# Patient Record
Sex: Female | Born: 1955 | Race: White | Hispanic: No | Marital: Married | Smoking: Never smoker
Health system: Southern US, Community
[De-identification: ages and names within clinical notes are randomized; demographics above are authoritative.]

## PROBLEM LIST (undated history)

## (undated) DIAGNOSIS — F419 Anxiety disorder, unspecified: Secondary | ICD-10-CM

## (undated) DIAGNOSIS — E785 Hyperlipidemia, unspecified: Secondary | ICD-10-CM

## (undated) DIAGNOSIS — M810 Age-related osteoporosis without current pathological fracture: Secondary | ICD-10-CM

## (undated) DIAGNOSIS — C541 Malignant neoplasm of endometrium: Secondary | ICD-10-CM

## (undated) HISTORY — DX: Age-related osteoporosis without current pathological fracture: M81.0

## (undated) HISTORY — PX: FOOT SURGERY: SHX648

## (undated) HISTORY — DX: Anxiety disorder, unspecified: F41.9

## (undated) HISTORY — DX: Hyperlipidemia, unspecified: E78.5

## (undated) HISTORY — DX: Malignant neoplasm of endometrium: C54.1

---

## 2001-01-07 ENCOUNTER — Encounter: Admission: RE | Admit: 2001-01-07 | Discharge: 2001-01-07 | Payer: Self-pay | Admitting: Gynecology

## 2001-01-07 ENCOUNTER — Encounter: Payer: Self-pay | Admitting: Gynecology

## 2001-08-12 ENCOUNTER — Other Ambulatory Visit: Admission: RE | Admit: 2001-08-12 | Discharge: 2001-08-12 | Payer: Self-pay | Admitting: Gynecology

## 2001-10-28 ENCOUNTER — Other Ambulatory Visit: Admission: RE | Admit: 2001-10-28 | Discharge: 2001-10-28 | Payer: Self-pay | Admitting: Gynecology

## 2002-05-12 ENCOUNTER — Other Ambulatory Visit: Admission: RE | Admit: 2002-05-12 | Discharge: 2002-05-12 | Payer: Self-pay | Admitting: Gynecology

## 2002-08-11 ENCOUNTER — Encounter: Payer: Self-pay | Admitting: Gynecology

## 2002-08-11 ENCOUNTER — Encounter: Admission: RE | Admit: 2002-08-11 | Discharge: 2002-08-11 | Payer: Self-pay | Admitting: Gynecology

## 2003-02-23 ENCOUNTER — Other Ambulatory Visit: Admission: RE | Admit: 2003-02-23 | Discharge: 2003-02-23 | Payer: Self-pay | Admitting: Gynecology

## 2003-08-17 ENCOUNTER — Encounter: Admission: RE | Admit: 2003-08-17 | Discharge: 2003-08-17 | Payer: Self-pay | Admitting: Gynecology

## 2003-08-17 ENCOUNTER — Encounter: Payer: Self-pay | Admitting: Gynecology

## 2004-08-15 ENCOUNTER — Other Ambulatory Visit: Admission: RE | Admit: 2004-08-15 | Discharge: 2004-08-15 | Payer: Self-pay | Admitting: Gynecology

## 2004-08-22 ENCOUNTER — Encounter: Admission: RE | Admit: 2004-08-22 | Discharge: 2004-08-22 | Payer: Self-pay | Admitting: Gynecology

## 2004-09-06 ENCOUNTER — Encounter: Admission: RE | Admit: 2004-09-06 | Discharge: 2004-09-06 | Payer: Self-pay | Admitting: Gynecology

## 2005-09-25 ENCOUNTER — Other Ambulatory Visit: Admission: RE | Admit: 2005-09-25 | Discharge: 2005-09-25 | Payer: Self-pay | Admitting: Gynecology

## 2006-10-08 ENCOUNTER — Other Ambulatory Visit: Admission: RE | Admit: 2006-10-08 | Discharge: 2006-10-08 | Payer: Self-pay | Admitting: Gynecology

## 2006-10-22 ENCOUNTER — Encounter: Admission: RE | Admit: 2006-10-22 | Discharge: 2006-10-22 | Payer: Self-pay | Admitting: Gynecology

## 2007-11-11 ENCOUNTER — Other Ambulatory Visit: Admission: RE | Admit: 2007-11-11 | Discharge: 2007-11-11 | Payer: Self-pay | Admitting: Gynecology

## 2007-11-14 ENCOUNTER — Encounter: Admission: RE | Admit: 2007-11-14 | Discharge: 2007-11-14 | Payer: Self-pay | Admitting: Gynecology

## 2009-02-01 ENCOUNTER — Encounter: Admission: RE | Admit: 2009-02-01 | Discharge: 2009-02-01 | Payer: Self-pay | Admitting: Gynecology

## 2009-09-21 ENCOUNTER — Encounter (INDEPENDENT_AMBULATORY_CARE_PROVIDER_SITE_OTHER): Payer: Self-pay | Admitting: *Deleted

## 2009-10-11 ENCOUNTER — Ambulatory Visit: Payer: Self-pay | Admitting: Internal Medicine

## 2009-10-26 ENCOUNTER — Ambulatory Visit: Payer: Self-pay | Admitting: Internal Medicine

## 2009-11-02 ENCOUNTER — Encounter: Payer: Self-pay | Admitting: Internal Medicine

## 2010-02-14 ENCOUNTER — Encounter: Admission: RE | Admit: 2010-02-14 | Discharge: 2010-02-14 | Payer: Self-pay | Admitting: Gynecology

## 2010-12-04 DIAGNOSIS — C541 Malignant neoplasm of endometrium: Secondary | ICD-10-CM

## 2010-12-04 HISTORY — DX: Malignant neoplasm of endometrium: C54.1

## 2010-12-04 HISTORY — PX: ABDOMINAL HYSTERECTOMY: SHX81

## 2010-12-24 ENCOUNTER — Encounter: Payer: Self-pay | Admitting: Gynecology

## 2011-05-15 ENCOUNTER — Other Ambulatory Visit: Payer: Self-pay | Admitting: Gynecology

## 2011-05-15 ENCOUNTER — Ambulatory Visit
Admission: RE | Admit: 2011-05-15 | Discharge: 2011-05-15 | Disposition: A | Discharge status: Home / Self Care | Payer: BC Managed Care – PPO | Source: Ambulatory Visit | Attending: Gynecology | Admitting: Gynecology

## 2011-05-15 DIAGNOSIS — Z1231 Encounter for screening mammogram for malignant neoplasm of breast: Secondary | ICD-10-CM

## 2011-08-02 ENCOUNTER — Other Ambulatory Visit: Payer: Self-pay | Admitting: Gynecology

## 2011-09-25 ENCOUNTER — Ambulatory Visit (HOSPITAL_BASED_OUTPATIENT_CLINIC_OR_DEPARTMENT_OTHER)
Admission: RE | Admit: 2011-09-25 | Discharge: 2011-09-25 | Disposition: A | Discharge status: Home / Self Care | Payer: BC Managed Care – PPO | Source: Ambulatory Visit | Attending: Gynecology | Admitting: Gynecology

## 2011-09-25 ENCOUNTER — Other Ambulatory Visit: Payer: Self-pay | Admitting: Gynecology

## 2011-09-25 DIAGNOSIS — Z01812 Encounter for preprocedural laboratory examination: Secondary | ICD-10-CM | POA: Insufficient documentation

## 2011-09-25 DIAGNOSIS — N840 Polyp of corpus uteri: Secondary | ICD-10-CM | POA: Insufficient documentation

## 2011-09-25 DIAGNOSIS — Z01810 Encounter for preprocedural cardiovascular examination: Secondary | ICD-10-CM | POA: Insufficient documentation

## 2011-09-25 NOTE — Op Note (Signed)
  NAME:  Kelli Mclean, Kelli Mclean NO.:  [id number]  MEDICAL RECORD NO.:  [id number]  LOCATION:                                 FACILITY:  PHYSICIAN:  Gretta Cool, M.D. DATE OF BIRTH:  1956-06-11  DATE OF PROCEDURE:  09/25/2011 DATE OF DISCHARGE:                              OPERATIVE REPORT   PREOPERATIVE DIAGNOSIS:  Multiple endometrial polyps with postmenopausal bleeding.  POSTOPERATIVE DIAGNOSIS:  Multiple endometrial polyps with postmenopausal bleeding.  PROCEDURE:  Resection of multiple endometrial polyps.  SURGEON:  Gretta Cool, MD  ANESTHESIA:  IV sedation and paracervical block.  DESCRIPTION OF PROCEDURE:  Under excellent anesthesia as above, with the patient prepped and draped in lithotomy position, the bladder was drained.  The cervix was then grasped with a single-tooth tenaculum and progressively dilated with a series of Pratt dilators to accommodate 7- mm resectoscope.  The resectoscope was then inserted and the cavity photographed.  There were multiple polyps present in the endometrial cavity, particularly in both cornual areas.  The polyps were progressively resected, resecting virtually all of the endometrial cavity to effectively resect the endometrial polyps.  The bleeding points were treated by cautery and VaporTrode near the cornual area for persistent bleeding.  At this point, the procedure was terminated without complication.  Tissue submitted to Path.  FLUID DEFICIT:  135 cc.  COMPLICATIONS:  None.          ______________________________ Gretta Cool, M.D.     CWL/MEDQ  D:  09/25/2011  T:  09/25/2011  Job:  [id number]  cc:   Harrel Lemon. Merla Riches, M.D. Fax: [phone]  Stan Head. Cleta Alberts, M.D. Fax: [phone]  Electronically Signed by Beather Arbour M.D. on 09/25/2011 01:11:15 PM

## 2012-02-15 ENCOUNTER — Ambulatory Visit: Payer: BC Managed Care – PPO

## 2012-02-15 ENCOUNTER — Ambulatory Visit (INDEPENDENT_AMBULATORY_CARE_PROVIDER_SITE_OTHER): Payer: BC Managed Care – PPO | Admitting: Internal Medicine

## 2012-02-15 VITALS — BP 129/81 | HR 56 | Temp 98.4°F | Resp 16 | Ht 62.0 in | Wt 127.8 lb

## 2012-02-15 DIAGNOSIS — M79609 Pain in unspecified limb: Secondary | ICD-10-CM

## 2012-02-15 DIAGNOSIS — R42 Dizziness and giddiness: Secondary | ICD-10-CM

## 2012-02-15 DIAGNOSIS — R079 Chest pain, unspecified: Secondary | ICD-10-CM

## 2012-02-15 DIAGNOSIS — E785 Hyperlipidemia, unspecified: Secondary | ICD-10-CM | POA: Insufficient documentation

## 2012-02-15 DIAGNOSIS — Z8249 Family history of ischemic heart disease and other diseases of the circulatory system: Secondary | ICD-10-CM | POA: Insufficient documentation

## 2012-02-15 DIAGNOSIS — R110 Nausea: Secondary | ICD-10-CM

## 2012-02-15 MED ORDER — ASPIRIN 325 MG PO TABS: 325.0000 mg | ORAL_TABLET | Freq: Every day | ORAL | Status: DC

## 2012-02-15 NOTE — Progress Notes (Signed)
  Subjective:    Patient ID: Kelli Mclean, female    DOB: 1956-03-17, 56 y.o.   MRN: [id number]  HPI Has had L chest and arm pain for 3d, pain woke her 5am today. No hx of heart disease, HBP, DM, and does not smoke. Stat EKG today is normal with the dull, vague pain. Has assoc. Nausea, and dizzy. No syncope, vomiting.  Does take statin and 81mg  asa. No other health issues   Review of Systems     Objective:   Physical Exam  Constitutional: She is oriented to person, place, and time. She appears well-developed and well-nourished.  Eyes: EOM are normal.  Neck: Normal range of motion. Neck supple.  Cardiovascular: Normal rate, regular rhythm and normal heart sounds.   Pulmonary/Chest: Effort normal and breath sounds normal.  Neurological: She is alert and oriented to person, place, and time.  Skin: Skin is warm and dry.  Psychiatric: She has a normal mood and affect.    [facility] reading (PRIMARY) by  Dr.Rena Sweeden CXR. NAD        Assessment & Plan:  Refer to [employer] cardiolog today or tomorrow. Spoke with Juanito Doom MD 325mg  ASA po now

## 2012-02-16 ENCOUNTER — Ambulatory Visit: Payer: Self-pay | Admitting: Physician Assistant

## 2012-02-19 ENCOUNTER — Ambulatory Visit (INDEPENDENT_AMBULATORY_CARE_PROVIDER_SITE_OTHER): Payer: BC Managed Care – PPO | Admitting: Internal Medicine

## 2012-02-19 ENCOUNTER — Encounter: Payer: Self-pay | Admitting: Internal Medicine

## 2012-02-19 ENCOUNTER — Telehealth: Payer: Self-pay | Admitting: Cardiovascular Disease

## 2012-02-19 VITALS — BP 118/78 | HR 78 | Ht 62.0 in | Wt 128.0 lb

## 2012-02-19 DIAGNOSIS — M79602 Pain in left arm: Secondary | ICD-10-CM

## 2012-02-19 DIAGNOSIS — E782 Mixed hyperlipidemia: Secondary | ICD-10-CM

## 2012-02-19 DIAGNOSIS — E785 Hyperlipidemia, unspecified: Secondary | ICD-10-CM

## 2012-02-19 DIAGNOSIS — R079 Chest pain, unspecified: Secondary | ICD-10-CM

## 2012-02-19 DIAGNOSIS — M79609 Pain in unspecified limb: Secondary | ICD-10-CM

## 2012-02-19 LAB — TROPONIN I: Troponin I: <0.3 ng/mL (ref <0.30)

## 2012-02-19 MED ORDER — NITROGLYCERIN 0.4 MG SL SUBL: 0.4000 mg | SUBLINGUAL_TABLET | SUBLINGUAL | Status: AC | PRN

## 2012-02-19 MED ORDER — SIMVASTATIN 20 MG PO TABS: 20.0000 mg | ORAL_TABLET | Freq: Every evening | ORAL | Status: DC

## 2012-02-19 NOTE — Telephone Encounter (Signed)
Call from [facility]. D-dimer is 0.52 which is just out of the normal range with upper limit of 0.48. Troponin negative. Spoke to Dr. Tenny Craw. No action necessary tonight. cdm

## 2012-02-19 NOTE — Progress Notes (Signed)
HPI  Patient is a 56 year old who was referred for evaluation of arm pain/chest pain.   A week ago today woke up wit pain shooting up L arm.  Stop.  Nausea, foggy.  Got up, got dressed  Went to work.  Came on intermittently at work.  BP at [employer]  OK  Was SOB. NO CP Tuesday chest heaviness  L arm ached.  No SOB  Heaviness in chest just stayed. Wednesday  Same. Shooting pain came/went.  Heaviness came and went  Over the weekend has had any arm pain, no chest heaviness.  No SOB  Just tired.  No dizziness.  Today still tired.  NO SOB or CP Has worked out this morning.  Did fine.  No CP  No SOB.  Aerobic activity for 25 minutes.  NO change in endurance. No Known Allergies  Current Outpatient Prescriptions  Medication Sig Dispense Refill  . aspirin 81 MG tablet Take 81 mg by mouth daily.      . cholecalciferol (VITAMIN D) 1000 UNITS tablet Take 1,000 Units by mouth daily.      . citalopram (CELEXA) 20 MG tablet Take 20 mg by mouth daily.      . fish oil-omega-3 fatty acids 1000 MG capsule Take 1 g by mouth daily.      Marland Kitchen GLUCOSAMINE CHONDROITIN COMPLX PO Take by mouth. Taking 2 daily      . Multiple Vitamins-Minerals (MULTIVITAMIN WITH MINERALS) tablet Take 1 tablet by mouth daily.      . simvastatin (ZOCOR) 20 MG tablet Take 20 mg by mouth every evening.      Marland Kitchen ibuprofen (ADVIL,MOTRIN) 600 MG tablet as needed.      Marland Kitchen VIVELLE-DOT 0.025 MG/24HR as directed.       Current Facility-Administered Medications  Medication Dose Route Frequency Provider Last Rate Last Dose  . aspirin tablet 325 mg  325 mg Oral Daily Jonita Albee, MD        Past Medical History  Diagnosis Date  . Hyperlipidemia     Past Surgical History  Procedure Date  . Abdominal hysterectomy     Family History  Problem Relation Age of Onset  . Heart disease Mother     History   Social History  . Marital Status: Married    Spouse Name: N/A    Number of Children: N/A  . Years of Education: N/A   Occupational  History  . Not on file.   Social History Main Topics  . Smoking status: Never Smoker   . Smokeless tobacco: Not on file  . Alcohol Use: Not on file  . Drug Use: Not on file  . Sexually Active: Not on file   Other Topics Concern  . Not on file   Social History Narrative  . No narrative on file    Review of Systems:  All systems reviewed.  They are negative to the above problem except as previously stated.  Vital Signs: BP 118/78  Pulse 78  Ht 5\' 2"  (1.575 m)  Wt 128 lb (58.06 kg)  BMI 23.41 kg/m2  Physical Exam Patient is in NAD HEENT:  Normocephalic, atraumatic. EOMI, PERRLA.  Neck: JVP is normal. No thyromegaly. No bruits.  Lungs: clear to auscultation. No rales no wheezes.  Heart: Regular rate and rhythm. Normal S1, S2. No S3.   No significant murmurs. PMI not displaced.  Abdomen:  Supple, nontender. Normal bowel sounds. No masses. No hepatomegaly.  Extremities:   Good distal pulses throughout.  No lower extremity edema. NO reproduction in symptoms with movement of arms. Musculoskeletal :moving all extremities.  Neuro:   alert and oriented x3.  CN II-XII grossly intact.  EKG:   SR>  Assessment and Plan:

## 2012-02-19 NOTE — Patient Instructions (Signed)
Lab work today We will call you with results. 

## 2012-02-21 DIAGNOSIS — R079 Chest pain, unspecified: Secondary | ICD-10-CM | POA: Insufficient documentation

## 2012-02-21 NOTE — Assessment & Plan Note (Signed)
Keep on statin. 

## 2012-02-21 NOTE — Assessment & Plan Note (Signed)
Patient;s symptoms are concerning  BUt, she worked out 25 minutes this morning without a problem. This is like a negative stress test. Exam is unremarkable.   I would keep on ASA.  NTG prn Will check Trop and d dimer. If negative, follow symptoms Take activities as tolerated.

## 2012-03-22 ENCOUNTER — Encounter: Payer: Self-pay | Admitting: Internal Medicine

## 2012-03-27 ENCOUNTER — Other Ambulatory Visit: Payer: Self-pay | Admitting: Gynecology

## 2012-05-30 ENCOUNTER — Other Ambulatory Visit: Payer: Self-pay | Admitting: Gynecology

## 2012-05-30 DIAGNOSIS — Z1231 Encounter for screening mammogram for malignant neoplasm of breast: Secondary | ICD-10-CM

## 2012-05-31 ENCOUNTER — Ambulatory Visit
Admission: RE | Admit: 2012-05-31 | Discharge: 2012-05-31 | Disposition: A | Discharge status: Home / Self Care | Payer: BC Managed Care – PPO | Source: Ambulatory Visit | Attending: Gynecology | Admitting: Gynecology

## 2012-05-31 DIAGNOSIS — Z1231 Encounter for screening mammogram for malignant neoplasm of breast: Secondary | ICD-10-CM

## 2012-08-29 ENCOUNTER — Other Ambulatory Visit: Payer: Self-pay | Admitting: Gynecology

## 2013-07-30 ENCOUNTER — Other Ambulatory Visit: Payer: Self-pay

## 2013-07-30 DIAGNOSIS — Z1231 Encounter for screening mammogram for malignant neoplasm of breast: Secondary | ICD-10-CM

## 2013-08-26 ENCOUNTER — Ambulatory Visit
Admission: RE | Admit: 2013-08-26 | Discharge: 2013-08-26 | Disposition: A | Discharge status: Home / Self Care | Payer: BC Managed Care – PPO | Source: Ambulatory Visit

## 2013-08-26 DIAGNOSIS — Z1231 Encounter for screening mammogram for malignant neoplasm of breast: Secondary | ICD-10-CM

## 2014-02-19 IMAGING — CR DG CHEST 2V
2 series · 2 of 2 positions shown · non-contrast
Comparison: Preliminary reading of Dr. Locklear

CLINICAL DATA: Left-sided chest pain

CHEST - 2 VIEW

[PA]
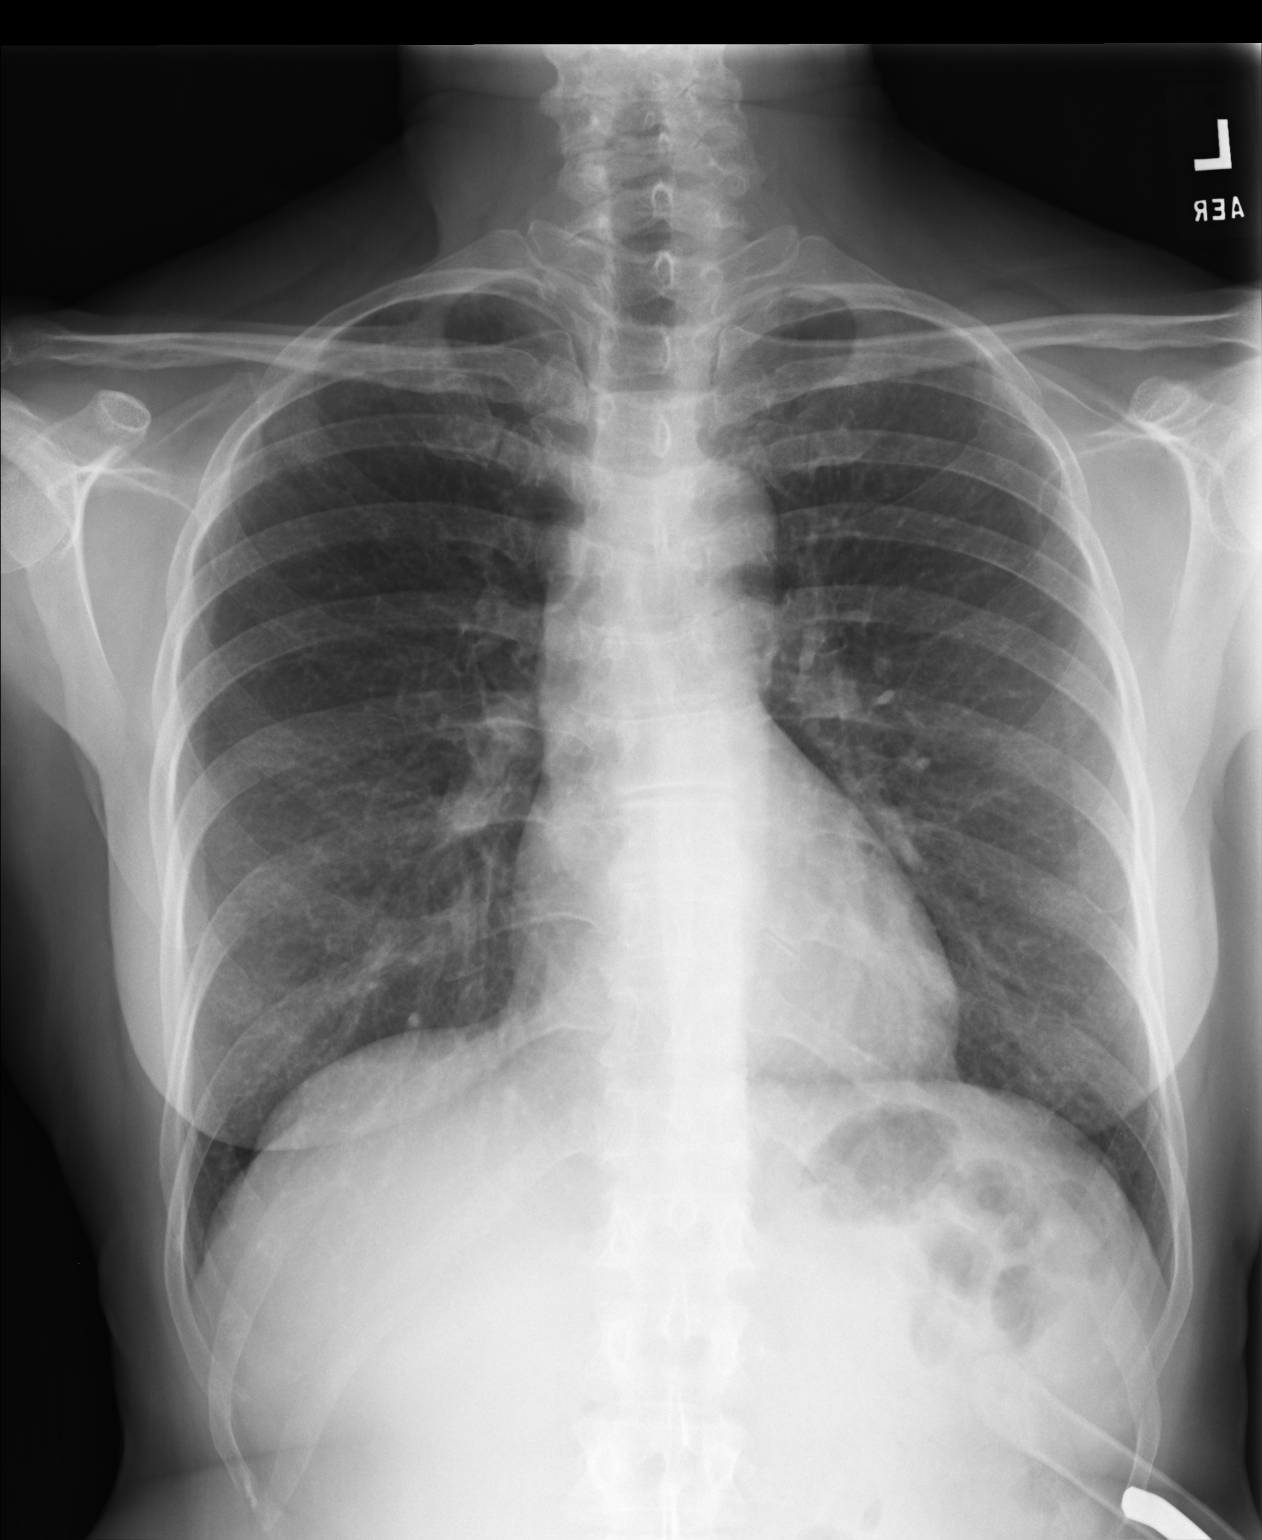

[lateral]
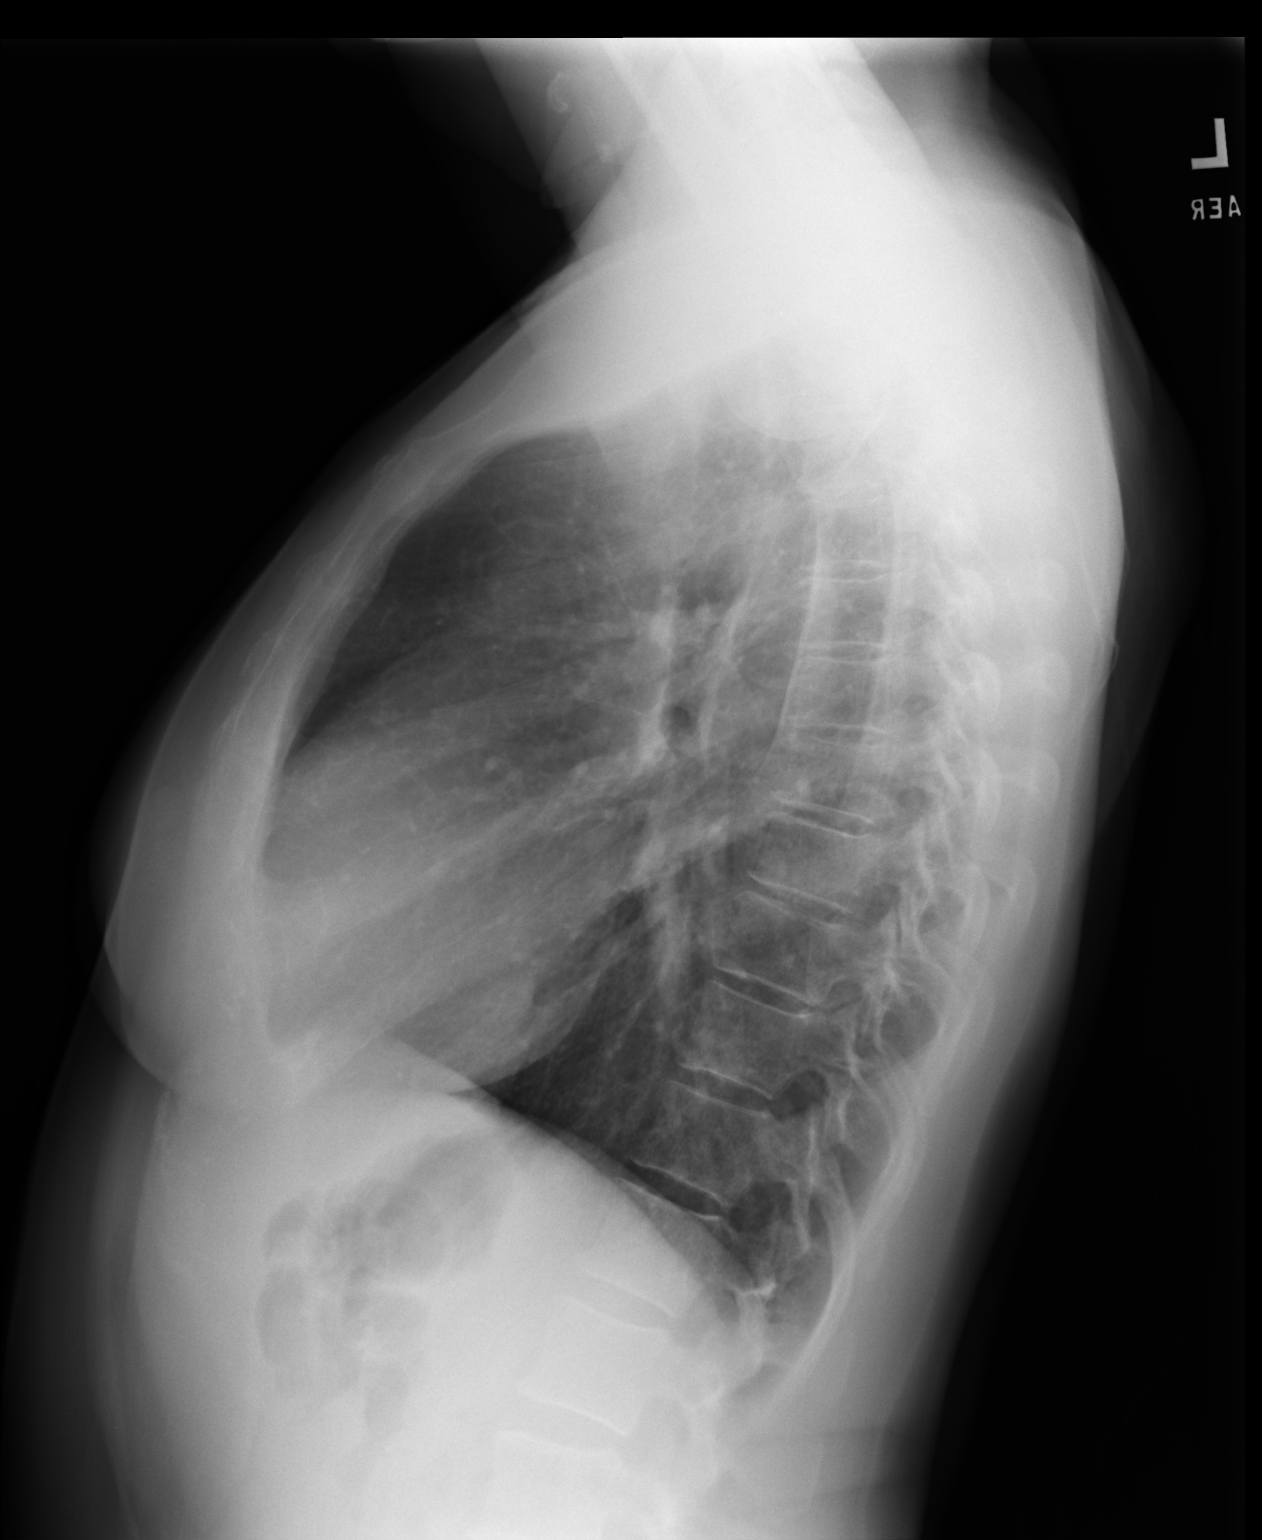

[2 of 2 positions shown; findings below may reference images not displayed]

FINDINGS: Cardiomediastinal silhouette is unremarkable.  No acute
infiltrate or pleural effusion.  No pulmonary edema. Bony thorax is
unremarkable.
IMPRESSION: No active disease.

## 2014-06-30 ENCOUNTER — Ambulatory Visit (INDEPENDENT_AMBULATORY_CARE_PROVIDER_SITE_OTHER): Payer: BC Managed Care – PPO

## 2014-06-30 ENCOUNTER — Ambulatory Visit: Payer: BC Managed Care – PPO

## 2014-06-30 ENCOUNTER — Ambulatory Visit (INDEPENDENT_AMBULATORY_CARE_PROVIDER_SITE_OTHER): Payer: BC Managed Care – PPO | Admitting: Family Medicine

## 2014-06-30 VITALS — BP 128/70 | HR 64 | Temp 98.7°F | Resp 16 | Ht 62.5 in | Wt 128.6 lb

## 2014-06-30 DIAGNOSIS — M25474 Effusion, right foot: Secondary | ICD-10-CM

## 2014-06-30 DIAGNOSIS — M79609 Pain in unspecified limb: Secondary | ICD-10-CM

## 2014-06-30 DIAGNOSIS — M25579 Pain in unspecified ankle and joints of unspecified foot: Secondary | ICD-10-CM

## 2014-06-30 DIAGNOSIS — M79671 Pain in right foot: Secondary | ICD-10-CM

## 2014-06-30 DIAGNOSIS — M25476 Effusion, unspecified foot: Secondary | ICD-10-CM

## 2014-06-30 DIAGNOSIS — M25571 Pain in right ankle and joints of right foot: Secondary | ICD-10-CM

## 2014-06-30 DIAGNOSIS — M25473 Effusion, unspecified ankle: Secondary | ICD-10-CM

## 2014-06-30 NOTE — Progress Notes (Addendum)
Subjective:    Patient ID: Kelli Mclean, female    DOB: 03/19/1956, 58 y.o.   MRN: [id number]  This chart was scribed for Merri Ray, MD by Erling Conte, Medical Scribe. This patient was sehen in Room 2 and the patient's care was started at 8:14 PM.  Chief Complaint  Patient presents with  . Foot Injury    rt foot slipped and fell July 5 th and still swollen and sore   HPI HPI Comments: Kelli Mclean is a 58 y.o. female who presents to the [facility] complaining of an injury to her right foot that occurred about 3 weeks ago. [place] She states she was able to walk and bear weight after the injury without much difficulty. She reports that she has had associated swelling and bruising to the injured area. She states that day after the injury she applied ice with some relief. Patient reports that since the injury she has had a "knot" on the top of her foot that is painful to the touch. She denies any broken bones in her foot previously. She states she previously had surgery just below her big toe for bone spurs.      Patient Active Problem List   Diagnosis Date Noted  . Chest pain 02/21/2012  . Hyperlipidemia 02/15/2012  . Family history of heart attack 02/15/2012   Past Medical History  Diagnosis Date  . Hyperlipidemia    Past Surgical History  Procedure Laterality Date  . Abdominal hysterectomy     No Known Allergies Prior to Admission medications   Medication Sig Start Date End Date Taking? Authorizing Provider  aspirin 81 MG tablet Take 81 mg by mouth daily.   Yes Historical Provider, MD  cholecalciferol (VITAMIN D) 1000 UNITS tablet Take 1,000 Units by mouth daily.   Yes Historical Provider, MD  fish oil-omega-3 fatty acids 1000 MG capsule Take 1 g by mouth daily.   Yes Historical Provider, MD  GLUCOSAMINE CHONDROITIN COMPLX PO Take by mouth. Taking 2  daily   Yes Historical Provider, MD  ibuprofen (ADVIL,MOTRIN) 600 MG tablet as needed. 11/21/11  Yes Historical Provider, MD  Multiple Vitamins-Minerals (MULTIVITAMIN WITH MINERALS) tablet Take 1 tablet by mouth daily.   Yes Historical Provider, MD  simvastatin (ZOCOR) 20 MG tablet Take 1 tablet (20 mg total) by mouth every evening. 02/19/12  Yes Fay Records, MD  citalopram (CELEXA) 20 MG tablet Take 20 mg by mouth daily.    Historical Provider, MD  VIVELLE-DOT 0.025 MG/24HR as directed. 01/01/12   Historical Provider, MD   History   Social History  . Marital Status: Married    Spouse Name: N/A    Number of Children: N/A  . Years of Education: N/A   Occupational History  . Not on file.   Social History Main Topics  . Smoking status: Never Smoker   . Smokeless tobacco: Not on file  . Alcohol Use: Not on file  . Drug Use: Not on file  . Sexual Activity: Not on file   Other Topics Concern  . Not on file   Social History Narrative  . No narrative on file     Review of Systems  Musculoskeletal: Positive for arthralgias (left foot) and joint swelling (right foot, mostly resolved).  Skin: Positive for color change (bruising to the top and side of  foot).  All other systems reviewed and are negative.      Objective:   Physical Exam  Nursing note and vitals reviewed. Constitutional: She is oriented to person, place, and time. She appears well-developed and well-nourished. No distress.  HENT:  Head: Normocephalic and atraumatic.  Eyes: Conjunctivae and EOM are normal.  Neck: Neck supple. No tracheal deviation present.  Cardiovascular: Normal rate.   Pulmonary/Chest: Effort normal. No respiratory distress.  Musculoskeletal: Normal range of motion.       Right ankle: She exhibits swelling (soft tissue with localized swelling just anterior to lateral malleolus). She exhibits normal range of motion. Achilles tendon normal. Achilles tendon exhibits no pain, no defect and normal  Thompson's test results.  slight tenderness along distal fibula just proximal to the malleolus Right Ankle: achilles tendon is non tender Full strength with plantar flexion Well healed scar over dorsum of right great toe, non tender Navicula non tender Some tenderness along base of 5th MT to mid 5th MT Tenderness over faint at base of second 3/5th toes on dorsum of foot only Diffused tenderness on dorsum of foot NVI distally  Neurological: She is alert and oriented to person, place, and time.  Skin: Skin is warm and dry.  Psychiatric: She has a normal mood and affect. Her behavior is normal.    Filed Vitals:   06/30/14 2006  BP: 128/70  Pulse: 64  Temp: 98.7 F (37.1 C)  TempSrc: Oral  Resp: 16  Height: 5' 2.5" (1.588 m)  Weight: 128 lb 9.6 oz (58.333 kg)  SpO2: 100%    [facility] reading (PRIMARY) by  Dr. Carlota Raspberry:  R foot: negative, R ankle: negative. .     Assessment & Plan:  TANZA PELLOT is a 57 y.o. female Pain, ankle, right - Plan: DG Ankle Complete Right, DG Foot Complete Right  Foot pain, right - Plan: DG Ankle Complete Right, DG Foot Complete Right  Swelling of foot joint, right - Plan: DG Ankle Complete Right, DG Foot Complete Right Suspected initial ankle sprain with some residual swelling. Has good function and rom, and has been able to exercise on affected ankle. Discussed unlikely phlebitis in this area and no signs or sx's of calf swelling.  Sx care and HEP discussed, rtc precautions if not improving in next few weeks.   No orders of the defined types were placed in this encounter.   Patient Instructions  No broken bones seen on xray. Relative rest as able, and elevate foot when possible.  If swelling not improving in next few weeks, recheck.  Return to the clinic or go to the nearest emergency room if any of your symptoms worsen or new symptoms occur.

## 2014-06-30 NOTE — Patient Instructions (Signed)
No broken bones seen on xray. Relative rest as able, and elevate foot when possible.  If swelling not improving in next few weeks, recheck.  Return to the clinic or go to the nearest emergency room if any of your symptoms worsen or new symptoms occur.

## 2014-07-23 ENCOUNTER — Ambulatory Visit (INDEPENDENT_AMBULATORY_CARE_PROVIDER_SITE_OTHER): Payer: BC Managed Care – PPO | Admitting: Family Medicine

## 2014-07-23 VITALS — BP 136/80 | HR 70 | Temp 98.9°F | Resp 16 | Ht 62.0 in | Wt 125.6 lb

## 2014-07-23 DIAGNOSIS — R197 Diarrhea, unspecified: Secondary | ICD-10-CM

## 2014-07-23 DIAGNOSIS — R110 Nausea: Secondary | ICD-10-CM

## 2014-07-23 DIAGNOSIS — A09 Infectious gastroenteritis and colitis, unspecified: Secondary | ICD-10-CM

## 2014-07-23 DIAGNOSIS — K5289 Other specified noninfective gastroenteritis and colitis: Secondary | ICD-10-CM

## 2014-07-23 MED ORDER — CIPROFLOXACIN HCL 500 MG PO TABS: 500.0000 mg | ORAL_TABLET | Freq: Two times a day (BID) | ORAL | Status: DC

## 2014-07-23 MED ORDER — ONDANSETRON 4 MG PO TBDP: 4.0000 mg | ORAL_TABLET | Freq: Three times a day (TID) | ORAL | Status: DC | PRN

## 2014-07-23 NOTE — Patient Instructions (Signed)
Food Choices to Help Relieve Diarrhea °When you have diarrhea, the foods you eat and your eating habits are very important. Choosing the right foods and drinks can help relieve diarrhea. Also, because diarrhea can last up to 7 days, you need to replace lost fluids and electrolytes (such as sodium, potassium, and chloride) in order to help prevent dehydration.  °WHAT GENERAL GUIDELINES DO I NEED TO FOLLOW? °· Slowly drink 1 cup (8 oz) of fluid for each episode of diarrhea. If you are getting enough fluid, your urine will be clear or pale yellow. °· Eat starchy foods. Some good choices include white rice, white toast, pasta, low-fiber cereal, baked potatoes (without the skin), saltine crackers, and bagels. °· Avoid large servings of any cooked vegetables. °· Limit fruit to two servings per day. A serving is ½ cup or 1 small piece. °· Choose foods with less than 2 g of fiber per serving. °· Limit fats to less than 8 tsp (38 g) per day. °· Avoid fried foods. °· Eat foods that have probiotics in them. Probiotics can be found in certain dairy products. °· Avoid foods and beverages that may increase the speed at which food moves through the stomach and intestines (gastrointestinal tract). Things to avoid include: °¨ High-fiber foods, such as dried fruit, raw fruits and vegetables, nuts, seeds, and whole grain foods. °¨ Spicy foods and high-fat foods. °¨ Foods and beverages sweetened with high-fructose corn syrup, honey, or sugar alcohols such as xylitol, sorbitol, and mannitol. °WHAT FOODS ARE RECOMMENDED? °Grains °White rice. White, French, or pita breads (fresh or toasted), including plain rolls, buns, or bagels. White pasta. Saltine, soda, or graham crackers. Pretzels. Low-fiber cereal. Cooked cereals made with water (such as cornmeal, farina, or cream cereals). Plain muffins. Matzo. Melba toast. Zwieback.  °Vegetables °Potatoes (without the skin). Strained tomato and vegetable juices. Most well-cooked and canned  vegetables without seeds. Tender lettuce. °Fruits °Cooked or canned applesauce, apricots, cherries, fruit cocktail, grapefruit, peaches, pears, or plums. Fresh bananas, apples without skin, cherries, grapes, cantaloupe, grapefruit, peaches, oranges, or plums.  °Meat and Other Protein Products °Baked or boiled chicken. Eggs. Tofu. Fish. Seafood. Smooth peanut butter. Ground or well-cooked tender beef, ham, veal, lamb, pork, or poultry.  °Dairy °Plain yogurt, kefir, and unsweetened liquid yogurt. Lactose-free milk, buttermilk, or soy milk. Plain hard cheese. °Beverages °Sport drinks. Clear broths. Diluted fruit juices (except prune). Regular, caffeine-free sodas such as ginger ale. Water. Decaffeinated teas. Oral rehydration solutions. Sugar-free beverages not sweetened with sugar alcohols. °Other °Bouillon, broth, or soups made from recommended foods.  °The items listed above may not be a complete list of recommended foods or beverages. Contact your dietitian for more options. °WHAT FOODS ARE NOT RECOMMENDED? °Grains °Whole grain, whole wheat, bran, or rye breads, rolls, pastas, crackers, and cereals. Wild or brown rice. Cereals that contain more than 2 g of fiber per serving. Corn tortillas or taco shells. Cooked or dry oatmeal. Granola. Popcorn. °Vegetables °Raw vegetables. Cabbage, broccoli, Brussels sprouts, artichokes, baked beans, beet greens, corn, kale, legumes, peas, sweet potatoes, and yams. Potato skins. Cooked spinach and cabbage. °Fruits °Dried fruit, including raisins and dates. Raw fruits. Stewed or dried prunes. Fresh apples with skin, apricots, mangoes, pears, raspberries, and strawberries.  °Meat and Other Protein Products °Chunky peanut butter. Nuts and seeds. Beans and lentils. Bacon.  °Dairy °High-fat cheeses. Milk, chocolate milk, and beverages made with milk, such as milk shakes. Cream. Ice cream. °Sweets and Desserts °Sweet rolls, doughnuts, and sweet breads. Pancakes   and waffles. °Fats and  Oils °Butter. Cream sauces. Margarine. Salad oils. Plain salad dressings. Olives. Avocados.  °Beverages °Caffeinated beverages (such as coffee, tea, soda, or energy drinks). Alcoholic beverages. Fruit juices with pulp. Prune juice. Soft drinks sweetened with high-fructose corn syrup or sugar alcohols. °Other °Coconut. Hot sauce. Chili powder. Mayonnaise. Gravy. Cream-based or milk-based soups.  °The items listed above may not be a complete list of foods and beverages to avoid. Contact your dietitian for more information. °WHAT SHOULD I DO IF I BECOME DEHYDRATED? °Diarrhea can sometimes lead to dehydration. Signs of dehydration include dark urine and dry mouth and skin. If you think you are dehydrated, you should rehydrate with an oral rehydration solution. These solutions can be purchased at pharmacies, retail stores, or online.  °Drink ½-1 cup (120-240 mL) of oral rehydration solution each time you have an episode of diarrhea. If drinking this amount makes your diarrhea worse, try drinking smaller amounts more often. For example, drink 1-3 tsp (5-15 mL) every 5-10 minutes.  °A general rule for staying hydrated is to drink 1½-2 L of fluid per day. Talk to your health care provider about the specific amount you should be drinking each day. Drink enough fluids to keep your urine clear or pale yellow. °Document Released: 02/10/2004 Document Revised: 11/25/2013 Document Reviewed: 10/13/2013 °ExitCare® Patient Information ©2015 [employer]. This information is not intended to replace advice given to you by your health care provider. Make sure you discuss any questions you have with your health care provider. °Diarrhea °Diarrhea is frequent loose and watery bowel movements. It can cause you to feel weak and dehydrated. Dehydration can cause you to become tired and thirsty, have a dry mouth, and have decreased urination that often is dark yellow. Diarrhea is a sign of another problem, most often an infection that will  not last long. In most cases, diarrhea typically lasts 2-3 days. However, it can last longer if it is a sign of something more serious. It is important to treat your diarrhea as directed by your caregiver to lessen or prevent future episodes of diarrhea. °CAUSES  °Some common causes include: °· Gastrointestinal infections caused by viruses, bacteria, or parasites. °· Food poisoning or food allergies. °· Certain medicines, such as antibiotics, chemotherapy, and laxatives. °· Artificial sweeteners and fructose. °· Digestive disorders. °HOME CARE INSTRUCTIONS °· Ensure adequate fluid intake (hydration): Have 1 cup (8 oz) of fluid for each diarrhea episode. Avoid fluids that contain simple sugars or sports drinks, fruit juices, whole milk products, and sodas. Your urine should be clear or pale yellow if you are drinking enough fluids. Hydrate with an oral rehydration solution that you can purchase at pharmacies, retail stores, and online. You can prepare an oral rehydration solution at home by mixing the following ingredients together: °¨  - tsp table salt. °¨ ¾ tsp baking soda. °¨  tsp salt substitute containing potassium chloride. °¨ 1  tablespoons sugar. °¨ 1 L (34 oz) of water. °· Certain foods and beverages may increase the speed at which food moves through the gastrointestinal (GI) tract. These foods and beverages should be avoided and include: °¨ Caffeinated and alcoholic beverages. °¨ High-fiber foods, such as raw fruits and vegetables, nuts, seeds, and whole grain breads and cereals. °¨ Foods and beverages sweetened with sugar alcohols, such as xylitol, sorbitol, and mannitol. °· Some foods may be well tolerated and may help thicken stool including: °¨ Starchy foods, such as rice, toast, pasta, low-sugar cereal, oatmeal, grits, baked potatoes,   crackers, and bagels. °¨ Bananas. °¨ Applesauce. °· Add probiotic-rich foods to help increase healthy bacteria in the GI tract, such as yogurt and fermented milk  products. °· Wash your hands well after each diarrhea episode. °· Only take over-the-counter or prescription medicines as directed by your caregiver. °· Take a warm bath to relieve any burning or pain from frequent diarrhea episodes. °SEEK IMMEDIATE MEDICAL CARE IF:  °· You are unable to keep fluids down. °· You have persistent vomiting. °· You have blood in your stool, or your stools are black and tarry. °· You do not urinate in 6-8 hours, or there is only a small amount of very dark urine. °· You have abdominal pain that increases or localizes. °· You have weakness, dizziness, confusion, or light-headedness. °· You have a severe headache. °· Your diarrhea gets worse or does not get better. °· You have a fever or persistent symptoms for more than 2-3 days. °· You have a fever and your symptoms suddenly get worse. °MAKE SURE YOU:  °· Understand these instructions. °· Will watch your condition. °· Will get help right away if you are not doing well or get worse. °Document Released: 11/10/2002 Document Revised: 04/06/2014 Document Reviewed: 07/28/2012 °ExitCare® Patient Information ©2015 [employer]. This information is not intended to replace advice given to you by your health care provider. Make sure you discuss any questions you have with your health care provider. ° °

## 2014-07-23 NOTE — Progress Notes (Signed)
 Chief Complaint:  Chief Complaint  Patient presents with  . Abdominal Pain    got sick while in [place] last thursday. she has been vomiting and diarrhea.      HPI: Kelli Mclean is a 58 y.o. female who is here for  Worsening nonbloody diarrhea since Thursday, she started having stomach problems while on vacation in [place], she ate something wrong on one of the excursion , she was sick and she took immodium which helped some but then the diarrhea returne,  today she was on "comode for 7 times" . She did not have HA, no neck rigidity, feels tired but is trying to make herself push fluids. She has not had an appetite . She has had poor appetitie, any solid food intake  goes through her. Currently no fevers or chills. No rashes or joint pain or confusion.   She is wondering if she should go back on her antianxiety meds.. She  had a panic attack but has not had it a long time. She was on an antidperessant. She was on Celexa 20 mg daily and she did well, last time she used it  9 months to 1 year, she was on for a long time 2 years. She is thinking about retaking it. She is under a lot of stress and not sure if the anxiety attack is due to all of this new onset illness or if she should really be on it   Past Medical History  Diagnosis Date  . Hyperlipidemia    Past Surgical History  Procedure Laterality Date  . Abdominal hysterectomy      uterine cancer   History   Social History  . Marital Status: Married    Spouse Name: N/A    Number of Children: N/A  . Years of Education: N/A   Social History Main Topics  . Smoking status: Never Smoker   . Smokeless tobacco: None  . Alcohol Use: No  . Drug Use: No  . Sexual Activity: None   Other Topics Concern  . None   Social History Narrative  . None   Family History  Problem Relation Age of Onset  . Heart disease Mother   . Heart failure Father   . Heart disease Paternal Grandfather    No Known Allergies Prior to  Admission medications   Medication Sig Start Date End Date Taking? Authorizing Provider  aspirin 81 MG tablet Take 81 mg by mouth daily.   Yes Historical Provider, MD  cholecalciferol (VITAMIN D) 1000 UNITS tablet Take 1,000 Units by mouth daily.   Yes Historical Provider, MD  fish oil-omega-3 fatty acids 1000 MG capsule Take 1 g by mouth daily.   Yes Historical Provider, MD  GLUCOSAMINE CHONDROITIN COMPLX PO Take by mouth. Taking 2 daily   Yes Historical Provider, MD  ibuprofen (ADVIL,MOTRIN) 600 MG tablet as needed. 11/21/11  Yes Historical Provider, MD  Multiple Vitamins-Minerals (MULTIVITAMIN WITH MINERALS) tablet Take 1 tablet by mouth daily.   Yes Historical Provider, MD  simvastatin (ZOCOR) 20 MG tablet Take 1 tablet (20 mg total) by mouth every evening. 02/19/12  Yes Fay Records, MD  citalopram (CELEXA) 20 MG tablet Take 20 mg by mouth daily.    Historical Provider, MD     ROS: The patient denies fevers, chills, night sweats, unintentional weight loss, chest pain, palpitations, wheezing, dyspnea on exertion, nausea, vomiting, abdominal pain, dysuria, hematuria, melena, numbness, weakness, or tingling. + Nausea, + nonbloody diarrhea.  All other systems have been reviewed and were otherwise negative with the exception of those mentioned in the HPI and as above.    PHYSICAL EXAM: Filed Vitals:   07/23/14 1954  BP: 136/80  Pulse: 70  Temp: 98.9 F (37.2 C)  Resp: 16   Filed Vitals:   07/23/14 1954  Height: 5\' 2"  (1.575 m)  Weight: 125 lb 9.6 oz (56.972 kg)   Body mass index is 22.97 kg/(m^2).  General: Alert, no acute distress HEENT:  Normocephalic, atraumatic, oropharynx patent. EOMI, PERRLA, mucosa moist, no exudates. Tm normal Cardiovascular:  Regular rate and rhythm, no rubs murmurs or gallops.  No Carotid bruits, radial pulse intact. No pedal edema.  Respiratory: Clear to auscultation bilaterally.  No wheezes, rales, or rhonchi.  No cyanosis, no use of accessory  musculature GI: No organomegaly, abdomen is soft and non-tender, positive bowel sounds.  No masses. Skin: No rashes. Neurologic: Facial musculature symmetric. Psychiatric: Patient is appropriate throughout our interaction. Lymphatic: No cervical lymphadenopathy Musculoskeletal: Gait intact.   LABS: Results for orders placed in visit on 02/19/12  D-DIMER, QUANTITATIVE      Result Value Ref Range   D-Dimer, Quant 0.52 (*) 0.00 - 0.48 ug/mL-FEU  TROPONIN I      Result Value Ref Range   Troponin I <0.30  <0.30 ng/mL     EKG/XRAY:   Primary read interpreted by Dr. Marin Comment at [facility].   ASSESSMENT/PLAN: Encounter Diagnoses  Name Primary?  . Nausea alone Yes  . Diarrhea   . Traveler's diarrhea   . Other noninfectious gastroenteritis    BRAT diet, push fluids Cipro 500 mg BID x 5 days Zofran ODT She will let me know once this is over if she needs to go back onto her celexa for anxiety. IF she does we can restart her on it for 6 months. Then recheck in 6 months.  F/u prn  Gross sideeffects, risk and benefits, and alternatives of medications d/w patient. Patient is aware that all medications have potential sideeffects and we are unable to predict every sideeffect or drug-drug interaction that may occur.  , Miner, DO 07/23/2014 8:29 PM

## 2014-08-12 ENCOUNTER — Encounter: Payer: Self-pay | Admitting: Internal Medicine

## 2014-10-02 ENCOUNTER — Other Ambulatory Visit: Payer: Self-pay

## 2014-10-02 DIAGNOSIS — Z1231 Encounter for screening mammogram for malignant neoplasm of breast: Secondary | ICD-10-CM

## 2014-10-05 ENCOUNTER — Ambulatory Visit
Admission: RE | Admit: 2014-10-05 | Discharge: 2014-10-05 | Disposition: A | Discharge status: Home / Self Care | Payer: BC Managed Care – PPO | Source: Ambulatory Visit

## 2014-10-05 ENCOUNTER — Encounter (INDEPENDENT_AMBULATORY_CARE_PROVIDER_SITE_OTHER): Payer: Self-pay

## 2014-10-05 DIAGNOSIS — Z1231 Encounter for screening mammogram for malignant neoplasm of breast: Secondary | ICD-10-CM

## 2014-11-18 ENCOUNTER — Ambulatory Visit (INDEPENDENT_AMBULATORY_CARE_PROVIDER_SITE_OTHER): Payer: BC Managed Care – PPO | Admitting: Family Medicine

## 2014-11-18 ENCOUNTER — Encounter: Payer: Self-pay | Admitting: Family Medicine

## 2014-11-18 VITALS — BP 110/80 | HR 77 | Temp 99.0°F | Resp 16 | Ht 62.0 in | Wt 119.6 lb

## 2014-11-18 DIAGNOSIS — F411 Generalized anxiety disorder: Secondary | ICD-10-CM

## 2014-11-18 DIAGNOSIS — Z0000 Encounter for general adult medical examination without abnormal findings: Secondary | ICD-10-CM

## 2014-11-18 DIAGNOSIS — N6452 Nipple discharge: Secondary | ICD-10-CM

## 2014-11-18 DIAGNOSIS — K644 Residual hemorrhoidal skin tags: Secondary | ICD-10-CM

## 2014-11-18 DIAGNOSIS — Z86010 Personal history of colonic polyps: Secondary | ICD-10-CM

## 2014-11-18 DIAGNOSIS — E782 Mixed hyperlipidemia: Secondary | ICD-10-CM

## 2014-11-18 DIAGNOSIS — Z23 Encounter for immunization: Secondary | ICD-10-CM

## 2014-11-18 DIAGNOSIS — Z1329 Encounter for screening for other suspected endocrine disorder: Secondary | ICD-10-CM

## 2014-11-18 DIAGNOSIS — E785 Hyperlipidemia, unspecified: Secondary | ICD-10-CM

## 2014-11-18 DIAGNOSIS — Z131 Encounter for screening for diabetes mellitus: Secondary | ICD-10-CM

## 2014-11-18 DIAGNOSIS — C55 Malignant neoplasm of uterus, part unspecified: Secondary | ICD-10-CM

## 2014-11-18 DIAGNOSIS — K648 Other hemorrhoids: Secondary | ICD-10-CM

## 2014-11-18 LAB — CBC WITH DIFFERENTIAL/PLATELET
BASOS PCT: 1 % (ref 0–1)
Basophils Absolute: 0 10*3/uL (ref 0.0–0.1)
EOS ABS: 0.1 10*3/uL (ref 0.0–0.7)
EOS PCT: 2 % (ref 0–5)
HEMATOCRIT: 43.9 % (ref 36.0–46.0)
Hemoglobin: 15.1 g/dL — ABNORMAL HIGH (ref 12.0–15.0)
Lymphocytes Relative: 31 % (ref 12–46)
Lymphs Abs: 1.4 10*3/uL (ref 0.7–4.0)
MCH: 31.6 pg (ref 26.0–34.0)
MCHC: 34.4 g/dL (ref 30.0–36.0)
MCV: 91.8 fL (ref 78.0–100.0)
MONOS PCT: 7 % (ref 3–12)
MPV: 11.4 fL (ref 9.4–12.4)
Monocytes Absolute: 0.3 10*3/uL (ref 0.1–1.0)
NEUTROS PCT: 59 % (ref 43–77)
Neutro Abs: 2.7 10*3/uL (ref 1.7–7.7)
Platelets: 200 10*3/uL (ref 150–400)
RBC: 4.78 MIL/uL (ref 3.87–5.11)
RDW: 13.8 % (ref 11.5–15.5)
WBC: 4.5 10*3/uL (ref 4.0–10.5)

## 2014-11-18 MED ORDER — HYDROCORTISONE ACETATE 25 MG RE SUPP: 25.0000 mg | Freq: Two times a day (BID) | RECTAL | Status: DC

## 2014-11-18 MED ORDER — SIMVASTATIN 20 MG PO TABS: 20.0000 mg | ORAL_TABLET | Freq: Every evening | ORAL | Status: DC

## 2014-11-18 MED ORDER — FLUOXETINE HCL 20 MG PO TABS: 20.0000 mg | ORAL_TABLET | Freq: Every day | ORAL | Status: DC

## 2014-11-18 MED ORDER — ALPRAZOLAM 0.25 MG PO TABS: 0.2500 mg | ORAL_TABLET | Freq: Every day | ORAL | Status: DC | PRN

## 2014-11-18 NOTE — Progress Notes (Addendum)
Subjective:    Patient ID: Kelli Mclean, female    DOB: 31-May-1956, 58 y.o.   MRN: [id number]  11/18/2014  Annual Exam   HPI This 58 y.o. female presents for Complete Physical Examination.  Last physical:  2013 Pap smear:  08/02/2011 by Ubaldo Glassing; but Lomax retired.   Mammogram:  10/05/2014 Colonoscopy: 10/26/2009; pedunculated polyp rectum; diverticulosis; repeat 5 years.  [place]. Bone density:  2013 mild osteopenia. TDAP:  Unsure.  Zostavax: never Influenza: 11/18/2014 Eye exam:  Never; +readers Dental exam:  Every six months.  Hemorrhoid issues: onset for the past several weeks.  Requesting medications.  R nipple leakage: s/p mammogram; now noticed that gown is blood stained on a regular basis.  Mammogram WNL.    Anxiety:  Previous Citalopram use without improvement; requesting Xanax; unable to sleep; excessive worry.  Excessive stress with work.    PSH:  Hysterectomy uterine cancer 2012; ovaries removed.  Followed every six months and then follow up with Lomax and then retired.  [facility] at [facility]. Family hx: Mother -- 41s living; CABG age 109; hyperlipidemia. 108 brother -- AMI with 3 stents   Review of Systems  Constitutional: Negative for fever, chills, diaphoresis, activity change, appetite change, fatigue and unexpected weight change.  HENT: Negative for congestion, dental problem, drooling, ear discharge, ear pain, facial swelling, hearing loss, mouth sores, nosebleeds, postnasal drip, rhinorrhea, sinus pressure, sneezing, sore throat, tinnitus, trouble swallowing and voice change.   Eyes: Negative for photophobia, pain, discharge, redness, itching and visual disturbance.  Respiratory: Negative for apnea, cough, choking, chest tightness, shortness of breath, wheezing and stridor.   Cardiovascular: Negative for chest pain, palpitations and leg swelling.  Gastrointestinal: Negative for nausea, vomiting, abdominal pain, diarrhea, constipation, blood in stool, abdominal  distention, anal bleeding and rectal pain.  Endocrine: Negative for cold intolerance, heat intolerance, polydipsia, polyphagia and polyuria.  Genitourinary: Negative for dysuria, urgency, frequency, hematuria, flank pain, decreased urine volume, vaginal bleeding, vaginal discharge, enuresis, difficulty urinating, genital sores, vaginal pain, menstrual problem, pelvic pain and dyspareunia.  Musculoskeletal: Negative for myalgias, back pain, joint swelling, arthralgias, gait problem, neck pain and neck stiffness.  Skin: Negative for color change, pallor, rash and wound.  Allergic/Immunologic: Negative for environmental allergies, food allergies and immunocompromised state.  Neurological: Negative for dizziness, tremors, seizures, syncope, facial asymmetry, speech difficulty, weakness, light-headedness, numbness and headaches.  Hematological: Negative for adenopathy. Does not bruise/bleed easily.  Psychiatric/Behavioral: Negative for suicidal ideas, hallucinations, behavioral problems, confusion, sleep disturbance, self-injury, dysphoric mood, decreased concentration and agitation. The patient is nervous/anxious. The patient is not hyperactive.     Past Medical History  Diagnosis Date  . Hyperlipidemia   . Anxiety   . Cancer    Past Surgical History  Procedure Laterality Date  . Abdominal hysterectomy      uterine cancer   No Known Allergies Current Outpatient Prescriptions  Medication Sig Dispense Refill  . aspirin 81 MG tablet Take 81 mg by mouth daily.    . cholecalciferol (VITAMIN D) 1000 UNITS tablet Take 1,000 Units by mouth daily.    . fish oil-omega-3 fatty acids 1000 MG capsule Take 1 g by mouth daily.    Marland Kitchen GLUCOSAMINE CHONDROITIN COMPLX PO Take by mouth. Taking 2 daily    . ibuprofen (ADVIL,MOTRIN) 600 MG tablet as needed.    . Multiple Vitamins-Minerals (MULTIVITAMIN WITH MINERALS) tablet Take 1 tablet by mouth daily.    . simvastatin (ZOCOR) 40 MG tablet Take 1 tablet (40 mg  total) by  mouth every evening. 90 tablet 3  . ALPRAZolam (XANAX) 0.25 MG tablet Take 1 tablet (0.25 mg total) by mouth daily as needed for anxiety. 20 tablet 1  . CINNAMON PO Take by mouth.    Marland Kitchen FLUoxetine (PROZAC) 20 MG tablet Take 1-2 tablets (20-40 mg total) by mouth daily. 180 tablet 3  . hydrocortisone (ANUSOL-HC) 25 MG suppository Place 1 suppository (25 mg total) rectally 2 (two) times daily. 12 suppository 2  . pravastatin (PRAVACHOL) 40 MG tablet Take 1 tablet (40 mg total) by mouth daily. 90 tablet 3   Current Facility-Administered Medications  Medication Dose Route Frequency Provider Last Rate Last Dose  . aspirin tablet 325 mg  325 mg Oral Daily Orma Flaming, MD           Objective:    BP 110/80 mmHg  Pulse 77  Temp(Src) 99 F (37.2 C) (Oral)  Resp 16  Ht 5\' 2"  (1.575 m)  Wt 119 lb 9.6 oz (54.25 kg)  BMI 21.87 kg/m2  SpO2 99% Physical Exam  Constitutional: She is oriented to person, place, and time. She appears well-developed and well-nourished. No distress.  HENT:  Head: Normocephalic and atraumatic.  Right Ear: External ear normal.  Left Ear: External ear normal.  Nose: Nose normal.  Mouth/Throat: Oropharynx is clear and moist.  Eyes: Conjunctivae and EOM are normal. Pupils are equal, round, and reactive to light.  Neck: Normal range of motion and full passive range of motion without pain. Neck supple. No JVD present. Carotid bruit is not present. No thyromegaly present.  Cardiovascular: Normal rate, regular rhythm and normal heart sounds.  Exam reveals no gallop and no friction rub.   No murmur heard. Pulmonary/Chest: Effort normal and breath sounds normal. She has no wheezes. She has no rales. Right breast exhibits no inverted nipple, no mass, no nipple discharge, no skin change and no tenderness. Left breast exhibits no inverted nipple, no mass, no nipple discharge, no skin change and no tenderness. Breasts are symmetrical.  Abdominal: Soft. Bowel sounds are  normal. She exhibits no distension and no mass. There is no tenderness. There is no rebound and no guarding.  Genitourinary: Vagina normal. Rectal exam shows external hemorrhoid. No breast swelling, tenderness, discharge or bleeding. There is no rash, tenderness or lesion on the right labia. There is no rash, tenderness or lesion on the left labia.  Musculoskeletal:       Right shoulder: Normal.       Left shoulder: Normal.       Cervical back: Normal.  Lymphadenopathy:    She has no cervical adenopathy.  Neurological: She is alert and oriented to person, place, and time. She has normal reflexes. No cranial nerve deficit. She exhibits normal muscle tone. Coordination normal.  Skin: Skin is warm and dry. No rash noted. She is not diaphoretic. No erythema. No pallor.  Psychiatric: She has a normal mood and affect. Her behavior is normal. Judgment and thought content normal.  Nursing note and vitals reviewed.   TDAP AND INFLUENZA VACCINES ADMINISTERED.    Assessment & Plan:   1. Annual physical exam   2. Hyperlipidemia   3. Screening for diabetes mellitus   4. Screening for thyroid disorder   5. Need for prophylactic vaccination and inoculation against influenza   6. Uterine cancer   7. Mixed hyperlipidemia   8. External hemorrhoid   9. Discharge from right nipple   10. History of colonic polyps   11. Need for Tdap  vaccination   12. Generalized anxiety disorder     1. Complete Physical examination: anticipatory guidance --- weight maintenance, exercise.  Pap smear obtained; mammogram UTD. Colonoscopy UTD.  Immunizations reviewed --- s/p TDAP and influenza vaccines. 2.  Hyperlipidemia: controlled; obtain labs; refill provided. 3.  Screening DMII: obtain glucose, HgbA1c. 4.  Screening thyroid: obtain TSH. 5. S/p influenza and TDAP vaccines. 6.  Uterine cancer: s/p hysterectomy; Dr. Ubaldo Glassing of gynecology retired; s/p hysterectomy at [facility].  Pap smear obtained.   7.  External hemorrhoid:  rx for Anusol HC suppositories provided. 8.  R nipple discharge: New. Refer for ductogram. 9.  Generalized anxiety disorder: New. Rx for Prozac and Xanax provided.   Meds ordered this encounter  Medications  . DISCONTD: simvastatin (ZOCOR) 20 MG tablet    Sig: Take 1 tablet (20 mg total) by mouth every evening.    Dispense:  30 tablet    Refill:  11  . DISCONTD: hydrocortisone (ANUSOL-HC) 25 MG suppository    Sig: Place 1 suppository (25 mg total) rectally 2 (two) times daily.    Dispense:  12 suppository    Refill:  2  . DISCONTD: simvastatin (ZOCOR) 20 MG tablet    Sig: Take 1 tablet (20 mg total) by mouth every evening.    Dispense:  90 tablet    Refill:  3  . hydrocortisone (ANUSOL-HC) 25 MG suppository    Sig: Place 1 suppository (25 mg total) rectally 2 (two) times daily.    Dispense:  12 suppository    Refill:  2  . DISCONTD: FLUoxetine (PROZAC) 20 MG tablet    Sig: Take 1 tablet (20 mg total) by mouth daily.    Dispense:  30 tablet    Refill:  3  . ALPRAZolam (XANAX) 0.25 MG tablet    Sig: Take 1 tablet (0.25 mg total) by mouth daily as needed for anxiety.    Dispense:  20 tablet    Refill:  1  . simvastatin (ZOCOR) 40 MG tablet    Sig: Take 1 tablet (40 mg total) by mouth every evening.    Dispense:  90 tablet    Refill:  3    Return in about 3 months (around 02/17/2015) for recheck.    Reginia Forts, M.D.  [facility] [address] [place], [place]  [address] [phone] phone [phone] fax

## 2014-11-18 NOTE — Patient Instructions (Signed)

## 2014-11-19 ENCOUNTER — Other Ambulatory Visit: Payer: Self-pay | Admitting: Family Medicine

## 2014-11-19 ENCOUNTER — Encounter: Payer: Self-pay | Admitting: Internal Medicine

## 2014-11-19 DIAGNOSIS — N6452 Nipple discharge: Secondary | ICD-10-CM

## 2014-11-19 LAB — COMPREHENSIVE METABOLIC PANEL
ALK PHOS: 93 U/L (ref 39–117)
ALT: 17 U/L (ref 0–35)
AST: 19 U/L (ref 0–37)
Albumin: 4.5 g/dL (ref 3.5–5.2)
BILIRUBIN TOTAL: 0.8 mg/dL (ref 0.2–1.2)
BUN: 19 mg/dL (ref 6–23)
CO2: 28 mEq/L (ref 19–32)
CREATININE: 0.75 mg/dL (ref 0.50–1.10)
Calcium: 9.6 mg/dL (ref 8.4–10.5)
Chloride: 105 mEq/L (ref 96–112)
GLUCOSE: 90 mg/dL (ref 70–99)
Potassium: 4.3 mEq/L (ref 3.5–5.3)
SODIUM: 141 meq/L (ref 135–145)
Total Protein: 6.8 g/dL (ref 6.0–8.3)

## 2014-11-19 LAB — LIPID PANEL
CHOL/HDL RATIO: 3 ratio
Cholesterol: 212 mg/dL — ABNORMAL HIGH (ref 0–200)
HDL: 70 mg/dL (ref >39)
LDL Cholesterol: 126 mg/dL — ABNORMAL HIGH (ref 0–99)
TRIGLYCERIDES: 80 mg/dL (ref <150)
VLDL: 16 mg/dL (ref 0–40)

## 2014-11-19 LAB — PAP IG (IMAGE GUIDED)

## 2014-11-19 LAB — HEMOGLOBIN A1C
HEMOGLOBIN A1C: 5.5 % (ref <5.7)
Mean Plasma Glucose: 111 mg/dL (ref <117)

## 2014-11-19 LAB — TSH: TSH: 1.225 u[IU]/mL (ref 0.350–4.500)

## 2014-11-30 MED ORDER — SIMVASTATIN 40 MG PO TABS: 40.0000 mg | ORAL_TABLET | Freq: Every evening | ORAL | Status: DC

## 2014-12-01 ENCOUNTER — Other Ambulatory Visit: Payer: Self-pay

## 2014-12-01 ENCOUNTER — Other Ambulatory Visit: Payer: Self-pay | Admitting: Family Medicine

## 2014-12-01 DIAGNOSIS — N6452 Nipple discharge: Secondary | ICD-10-CM

## 2014-12-08 ENCOUNTER — Other Ambulatory Visit: Payer: Self-pay | Admitting: Family Medicine

## 2014-12-08 ENCOUNTER — Ambulatory Visit
Admission: RE | Admit: 2014-12-08 | Discharge: 2014-12-08 | Disposition: A | Discharge status: Home / Self Care | Payer: BC Managed Care – PPO | Source: Ambulatory Visit | Attending: Family Medicine | Admitting: Family Medicine

## 2014-12-08 DIAGNOSIS — N6452 Nipple discharge: Secondary | ICD-10-CM

## 2014-12-31 ENCOUNTER — Inpatient Hospital Stay: Admission: RE | Admit: 2014-12-31 | Payer: BLUE CROSS/BLUE SHIELD | Source: Ambulatory Visit

## 2014-12-31 ENCOUNTER — Ambulatory Visit
Admission: RE | Admit: 2014-12-31 | Discharge: 2014-12-31 | Disposition: A | Discharge status: Home / Self Care | Payer: BLUE CROSS/BLUE SHIELD | Source: Ambulatory Visit

## 2014-12-31 ENCOUNTER — Other Ambulatory Visit: Payer: Self-pay | Admitting: Family Medicine

## 2014-12-31 DIAGNOSIS — N6452 Nipple discharge: Secondary | ICD-10-CM

## 2015-01-01 ENCOUNTER — Telehealth: Payer: Self-pay | Admitting: Internal Medicine

## 2015-01-04 NOTE — Telephone Encounter (Signed)
Left a message for patient to call back. 

## 2015-01-05 NOTE — Telephone Encounter (Signed)
Left a message for patient to call back. 

## 2015-01-06 NOTE — Telephone Encounter (Signed)
Spoke with patient and told her she will need an OV with MD or extender to be examined to determine the best way to treat her hemorrhoids. She is going to call back to schedule an OV.

## 2015-01-11 ENCOUNTER — Ambulatory Visit (INDEPENDENT_AMBULATORY_CARE_PROVIDER_SITE_OTHER): Payer: Self-pay | Admitting: Surgery

## 2015-01-11 NOTE — H&P (Signed)
History of Present Illness Kelli Mclean. Kelli Balch MD; 01/11/2015 2:36 PM) Patient words: eval right breast.  The patient is a 59 year old female who presents with a complaint of nipple discharge. Referred by Dr. Reginia Mclean for evaluation of persistent right nipple discharge.  This is a 59 yo female who presents with several months of persistent clear right nipple discharge. A few months ago, she began noticing some bloody, darker drainage. She underwent a diagnostic mammogram and ultrasound 12/08/14 after a routine screening mammogram in 11/15. These were all negative. She underwent two attempts at a ductogram on 12/08/14 and 12/31/14. 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 She is now referred for surgical evaluation.  The patient has a history of uterine cancer s/p TAH/BSO. No adjuvant therapy needed.   10/05/14 CLINICAL DATA: Screening.  EXAM: DIGITAL SCREENING BILATERAL MAMMOGRAM WITH 3D TOMO WITH CAD  COMPARISON: Previous exam(s).  ACR Breast Density Category c: The breast tissue is heterogeneously dense, which may obscure small masses.  FINDINGS: There are no findings suspicious for malignancy. Images were processed with CAD.  IMPRESSION: No mammographic evidence of malignancy. A result letter of this screening mammogram will be mailed directly to the patient.  RECOMMENDATION: Screening mammogram in one year. (Code:SM-B-01Y)  BI-RADS CATEGORY 1: Negative.   Electronically Signed By: Kelli Mclean M.D. On: 10/05/2014 19:31   12/08/14 CLINICAL DATA: 59 year old female who reports a several year history of spontaneous clear nipple discharge which has recently become rusty brown in the past several months.  EXAM: DIGITAL DIAGNOSTIC RIGHT MAMMOGRAM WITH CAD  ULTRASOUND RIGHT BREAST  COMPARISON: 10/05/2014, additional prior imaging dating back to 02/01/2009  ACR Breast Density  Category c: The breast tissue is heterogeneously dense, which may obscure small masses.  FINDINGS: No suspicious mass, calcifications, or other abnormality is identified. The breast parenchymal pattern appears stable dating back to at least 2012.  Mammographic images were processed with CAD.  On physical exam, a small amount of clear nipple discharge is expressed from a single duct in the central aspect of the nipple.  Targeted ultrasound of the right retroareolar region demonstrates mild duct ectasia without definite intraductal mass.  IMPRESSION: No mammographic or sonographic explanation is identified for the patient's nipple discharge.  RECOMMENDATION: Further evaluation with right ductogram is recommended.  I have discussed the findings and recommendations with the patient. Results were also provided in writing at the conclusion of the visit. If applicable, a reminder letter will be sent to the patient regarding the next appointment.  BI-RADS CATEGORY 0: Incomplete. Need additional imaging evaluation and/or prior mammograms for comparison.   Electronically Signed By: Kelli Mclean M.D. On: 12/08/2014 15:42   ADDENDUM REPORT: 12/31/2014 12:38 ADDENDUM: The patient returned on 12/31/2014 for a repeat attempt at a right ductogram. A small amount of clear, tan-colored fluid was elicited from a central orifice in the right nipple. The discharging orifice was cannulated with a ductogram needle. With injection of contrast, there was immediate reflux of contrast out of the nipple orifice. Subsequent spot magnification mammogram images demonstrated a small amount of contrast in a central retronipple duct, identical to that seen on 12/08/2014. No contrast extended more posteriorly. IMPRESSION: Obstruction of a central right nipple duct just behind the nipple. 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 An obstructing malignancy is significantly less likely based  on the history of spontaneous discharge for several years, but not excluded. RECOMMENDATION: Surgical consultation  for right duct excision. This has been discussed with the patient. She was given an appointment with Dr. Georgette Dover on 01/11/2015 at 9:30 a.m. BI-RADS CATEGORY: 4: Suspicious. Electronically Signed By: Kelli Mclean M.D. On: 12/31/2014 12:38  Other Problems (Kelli Mclean, [place]; 01/11/2015 9:58 AM) Anxiety Disorder Cancer Hemorrhoids Hypercholesterolemia Lump In Breast  Past Surgical History Kelli Mclean, CMA; 01/11/2015 9:58 AM) Colon Polyp Removal - Colonoscopy Hysterectomy (due to cancer) - Complete  Diagnostic Studies History Kelli Mclean, CMA; 01/11/2015 9:58 AM) Colonoscopy 5-10 years ago Mammogram within last year  Allergies Kelli Mclean, CMA; 01/11/2015 9:59 AM) No Known Drug Allergies 01/11/2015  Medication History (Kelli Mclean, CMA; 01/11/2015 10:01 AM) ALPRAZolam (0.25MG  Tablet, Oral) Active. Simvastatin (40MG  Tablet, Oral) Active. FLUoxetine HCl (20MG  Tablet, Oral) Active. Aspirin EC (81MG  Tablet DR, Oral) Active. FLUoxetine HCl (20MG  Capsule, Oral) Active. Fish Oil (1000MG  Capsule DR, Oral) Active. Vitamin D (1000UNIT Capsule, Oral) Active.  Social History Kelli Mclean, [place]; 01/11/2015 9:58 AM) Alcohol use Occasional alcohol use. Caffeine use Tea. No drug use Tobacco use Never smoker.  Family History Kelli Mclean, [place]; 01/11/2015 9:58 AM) Arthritis Father. Heart Disease Brother, Mother. Heart disease in female family member before age 70 Hypertension Father.  Pregnancy / Birth History Kelli Mclean, [place]; 01/11/2015 9:58 AM) Age at menarche 57 years. Age of menopause 35-60 Gravida 3 Maternal age 56-35 Para 3     Review of Systems ([place]; 01/11/2015 9:58 AM) General Not Present- Appetite Loss, Chills, Fatigue, Fever, Night Sweats, Weight Gain and Weight Loss. Skin Not Present- Change in Wart/Mole, Dryness, Hives, Jaundice,  New Lesions, Non-Healing Wounds, Rash and Ulcer. HEENT Not Present- Earache, Hearing Loss, Hoarseness, Nose Bleed, Oral Ulcers, Ringing in the Ears, Seasonal Allergies, Sinus Pain, Sore Throat, Visual Disturbances, Wears glasses/contact lenses and Yellow Eyes. Respiratory Not Present- Bloody sputum, Chronic Cough, Difficulty Breathing, Snoring and Wheezing. Breast Present- Breast Mass and Nipple Discharge. Not Present- Breast Pain and Skin Changes. Cardiovascular Not Present- Chest Pain, Difficulty Breathing Lying Down, Leg Cramps, Palpitations, Rapid Heart Rate, Shortness of Breath and Swelling of Extremities. Gastrointestinal Present- Hemorrhoids. Not Present- Abdominal Pain, Bloating, Bloody Stool, Change in Bowel Habits, Chronic diarrhea, Constipation, Difficulty Swallowing, Excessive gas, Gets full quickly at meals, Indigestion, Nausea, Rectal Pain and Vomiting. Female Genitourinary Not Present- Frequency, Nocturia, Painful Urination, Pelvic Pain and Urgency. Musculoskeletal Not Present- Back Pain, Joint Pain, Joint Stiffness, Muscle Pain, Muscle Weakness and Swelling of Extremities. Neurological Not Present- Decreased Memory, Fainting, Headaches, Numbness, Seizures, Tingling, Tremor, Trouble walking and Weakness. Psychiatric Present- Anxiety. Not Present- Bipolar, Change in Sleep Pattern, Depression, Fearful and Frequent crying. Endocrine Not Present- Cold Intolerance, Excessive Hunger, Hair Changes, Heat Intolerance, Hot flashes and New Diabetes. Hematology Not Present- Easy Bruising, Excessive bleeding, Gland problems, HIV and Persistent Infections.  Vitals (Kelli Mclean CMA; 01/11/2015 9:59 AM) 01/11/2015 9:59 AM Weight: 125 lb Height: 62in Body Surface Area: 1.57 m Body Mass Index: 22.86 kg/m Temp.: 97.1F(Temporal)  Pulse: 77 (Regular)  BP: 130/70 (Sitting, Left Arm, Standard)     Physical Exam Rodman Key K. Janene Yousuf MD; 01/11/2015 2:36 PM)  The physical exam findings are as  follows: Note:WDWN in NAD HEENT: EOMI, sclera anicteric Neck: No masses, no thyromegaly Lungs: CTA bilaterally; normal respiratory effort Breasts: bilateral fibrocystic changes; no dominant masses; no axillary lymphadenopathy No left nipple discharge Central duct of right nipple - clear drainage with mild palpation CV: Regular rate and rhythm; no murmurs Abd: +bowel sounds, soft, non-tender, no masses Ext: Well-perfused; no edema Skin: Warm,  dry; no sign of jaundice    Assessment & Plan Kelli Mclean. Manon Banbury MD; 01/11/2015 10:40 AM)  DISCHARGE FROM RIGHT NIPPLE (611.79  N64.52)  Current Plans Schedule for Surgery - right nipple duct excision. The surgical procedure has been discussed with the patient. Potential risks, benefits, alternative treatments, and expected outcomes have been explained. All of the patient's questions at this time have been answered. The likelihood of reaching the patient's treatment goal is good. The patient understand the proposed surgical procedure and wishes to proceed.   Kelli Mclean. Georgette Dover, MD, [facility] Surgery  General/ Trauma Surgery  01/11/2015 2:37 PM

## 2015-01-12 ENCOUNTER — Inpatient Hospital Stay: Admission: RE | Admit: 2015-01-12 | Payer: BLUE CROSS/BLUE SHIELD | Source: Ambulatory Visit

## 2015-01-19 ENCOUNTER — Ambulatory Visit (AMBULATORY_SURGERY_CENTER): Payer: Self-pay

## 2015-01-19 VITALS — Ht 62.0 in | Wt 123.8 lb

## 2015-01-19 DIAGNOSIS — Z860100 Personal history of colon polyps, unspecified: Secondary | ICD-10-CM

## 2015-01-19 MED ORDER — MOVIPREP 100 G PO SOLR: 1.0000 | Freq: Once | ORAL | Status: DC

## 2015-01-19 NOTE — Progress Notes (Signed)
No allergies to eggs or soy No home oxygen No diet/weight loss meds No past problems with anesthesia  Has email  Emmi instructions given for colonoscopy 

## 2015-01-26 ENCOUNTER — Encounter: Payer: Self-pay | Admitting: Internal Medicine

## 2015-01-26 ENCOUNTER — Ambulatory Visit (AMBULATORY_SURGERY_CENTER): Payer: BLUE CROSS/BLUE SHIELD | Admitting: Internal Medicine

## 2015-01-26 VITALS — BP 134/81 | HR 65 | Temp 98.0°F | Resp 17 | Ht 62.0 in | Wt 123.0 lb

## 2015-01-26 DIAGNOSIS — D122 Benign neoplasm of ascending colon: Secondary | ICD-10-CM

## 2015-01-26 DIAGNOSIS — D120 Benign neoplasm of cecum: Secondary | ICD-10-CM

## 2015-01-26 DIAGNOSIS — Z86010 Personal history of colonic polyps: Secondary | ICD-10-CM

## 2015-01-26 MED ORDER — SODIUM CHLORIDE 0.9 % IV SOLN: 500.0000 mL | INTRAVENOUS | Status: DC

## 2015-01-26 NOTE — Progress Notes (Signed)
Pt didn't pass air while in the RR.  Abd soft, easily palpable.  Denies discomfort of any kind.  Told to ambulate and drink warm fluids at home if cramping occurs.  Understanding voiced

## 2015-01-26 NOTE — Progress Notes (Signed)
Stable to RR 

## 2015-01-26 NOTE — Progress Notes (Signed)
Called to room to assist during endoscopic procedure.  Patient ID and intended procedure confirmed with present staff. Received instructions for my participation in the procedure from the performing physician.  

## 2015-01-26 NOTE — Patient Instructions (Signed)
YOU HAD AN ENDOSCOPIC PROCEDURE TODAY AT [facility]: Refer to the procedure report that was given to you for any specific questions about what was found during the examination.  If the procedure report does not answer your questions, please call your gastroenterologist to clarify.  If you requested that your care partner not be given the details of your procedure findings, then the procedure report has been included in a sealed envelope for you to review at your convenience later.  YOU SHOULD EXPECT: Some feelings of bloating in the abdomen. Passage of more gas than usual.  Walking can help get rid of the air that was put into your GI tract during the procedure and reduce the bloating. If you had a lower endoscopy (such as a colonoscopy or flexible sigmoidoscopy) you may notice spotting of blood in your stool or on the toilet paper. If you underwent a bowel prep for your procedure, then you may not have a normal bowel movement for a few days.  DIET: Your first meal following the procedure should be a light meal and then it is ok to progress to your normal diet.  A half-sandwich or bowl of soup is an example of a good first meal.  Heavy or fried foods are harder to digest and may make you feel nauseous or bloated.  Likewise meals heavy in dairy and vegetables can cause extra gas to form and this can also increase the bloating.  Drink plenty of fluids but you should avoid alcoholic beverages for 24 hours.  ACTIVITY: Your care partner should take you home directly after the procedure.  You should plan to take it easy, moving slowly for the rest of the day.  You can resume normal activity the day after the procedure however you should NOT DRIVE or use heavy machinery for 24 hours (because of the sedation medicines used during the test).    SYMPTOMS TO REPORT IMMEDIATELY: A gastroenterologist can be reached at any hour.  During normal business hours, 8:30 AM to 5:00 PM Monday through Friday,  call [phone].  After hours and on weekends, please call the GI answering service at [phone] who will take a message and have the physician on call contact you.   Following lower endoscopy (colonoscopy or flexible sigmoidoscopy):  Excessive amounts of blood in the stool  Significant tenderness or worsening of abdominal pains  Swelling of the abdomen that is new, acute  Fever of 100F or higher   FOLLOW UP: If any biopsies were taken you will be contacted by phone or by letter within the next 1-3 weeks.  Call your gastroenterologist if you have not heard about the biopsies in 3 weeks.  Our staff will call the home number listed on your records the next business day following your procedure to check on you and address any questions or concerns that you may have at that time regarding the information given to you following your procedure. This is a courtesy call and so if there is no answer at the home number and we have not heard from you through the emergency physician on call, we will assume that you have returned to your regular daily activities without incident.  SIGNATURES/CONFIDENTIALITY: You and/or your care partner have signed paperwork which will be entered into your electronic medical record.  These signatures attest to the fact that that the information above on your After Visit Summary has been reviewed and is understood.  Full responsibility of the confidentiality of  this discharge information lies with you and/or your care-partner.  No Aspirin, Aspirin containing products (BCs or Goody powders) or NSAIDS (Ibuprofen, Advil, Aleve, Motrin) for 2 weeks; Tylenol is ok    Please read over handouts about polyps, diverticulosis and high fiber diets  Continue your normal medications  You might note some irritation in your nose or some drainage.  This may cause feelings of congestion.  This is from the oxygen, which can be irritating.  There is no need for concern, this should  clear up in a day or so.

## 2015-01-26 NOTE — Op Note (Signed)
[facility]  [address] [place], [address]   COLONOSCOPY PROCEDURE REPORT  PATIENT: Kelli Mclean, Kelli Mclean  MR#: [id number] BIRTHDATE: 02-May-1956 , 48  yrs. old GENDER: female ENDOSCOPIST: Lafayette Dragon, MD REFERRED DZ:[id number] colonoascopy PROCEDURE DATE:  01/26/2015 PROCEDURE:   Colonoscopy with snare polypectomy and Colonoscopy with cold biopsy polypectomy First Screening Colonoscopy - Avg.  risk and is 50 yrs.  old or older - No.  Prior Negative Screening - Now for repeat screening. N/A  History of Adenoma - Now for follow-up colonoscopy & has been > or = to 3 yrs.  Yes hx of adenoma.  Has been 3 or more years since last colonoscopy.  Polyps Removed Today? Yes. ASA CLASS:   Class II INDICATIONS:ttubular adenoma removed on colonoscopy in November 2010. MEDICATIONS: Monitored anesthesia care and Propofol 300 mg IV  DESCRIPTION OF PROCEDURE:   After the risks benefits and alternatives of the procedure were thoroughly explained, informed consent was obtained.  The digital rectal exam revealed no abnormalities of the rectum.   The LB [id number]  endoscope was introduced through the anus and advanced to the cecum, which was identified by both the appendix and ileocecal valve. No adverse events experienced.   The quality of the prep was good, using MoviPrep  The instrument was then slowly withdrawn as the colon was fully examined.      COLON FINDINGS: Two firm sessile polyps measuring 7 mm in size were found at the cecum and appendiceal orifice.  A polypectomy was performed with a cold snare.  The resection was complete, the polyp tissue was completely retrieved and sent to histology.  A polypectomy was performed with cold forceps.  The resection was complete, the polyp tissue was completely retrieved and sent to histology.   There was mild diverticulosis noted in the sigmoid colon.  Retroflexed views revealed no abnormalities. The time to cecum=10 minutes  17 seconds.  Withdrawal time=9 minutes 56 seconds. The scope was withdrawn and the procedure completed. COMPLICATIONS: There were no immediate complications.  ENDOSCOPIC IMPRESSION: 1.   Two sessile polyps were found at the cecum and ascending colon; polypectomy was performed with a cold snare cecal polyp; polypectomy was performed with cold forceps in ascending colon 2.   Mild diverticulosis was noted in the sigmoid colon  RECOMMENDATIONS: 1.  Await pathology results 2.  High fiber diet Recall colonoscopy pending path report No aspirin or anti-inflammatory agents for 2 weeks  eSigned:  Lafayette Dragon, MD 01/26/2015 10:02 AM   cc:   PATIENT NAME:  Kelli Mclean, Kelli Mclean MR#: [id number]

## 2015-01-27 ENCOUNTER — Telehealth: Payer: Self-pay

## 2015-01-27 NOTE — Telephone Encounter (Signed)
  Follow up Call-  Call back number 01/26/2015  Post procedure Call Back phone  # [phone]  Permission to leave phone message Yes     Patient questions:  Do you have a fever, pain , or abdominal swelling? No. Pain Score  0 *  Have you tolerated food without any problems? Yes.    Have you been able to return to your normal activities? Yes.    Do you have any questions about your discharge instructions: Diet   No. Medications  No. Follow up visit  No.  Do you have questions or concerns about your Care? No.  Actions: * If pain score is 4 or above: No action needed, pain <4.

## 2015-02-02 ENCOUNTER — Encounter: Payer: Self-pay | Admitting: Internal Medicine

## 2015-02-08 ENCOUNTER — Ambulatory Visit (INDEPENDENT_AMBULATORY_CARE_PROVIDER_SITE_OTHER): Payer: BLUE CROSS/BLUE SHIELD | Admitting: Family Medicine

## 2015-02-08 ENCOUNTER — Encounter: Payer: Self-pay | Admitting: Family Medicine

## 2015-02-08 VITALS — BP 122/74 | HR 74 | Temp 98.4°F | Resp 16 | Ht 62.0 in | Wt 120.6 lb

## 2015-02-08 DIAGNOSIS — E785 Hyperlipidemia, unspecified: Secondary | ICD-10-CM | POA: Diagnosis not present

## 2015-02-08 DIAGNOSIS — N6452 Nipple discharge: Secondary | ICD-10-CM | POA: Diagnosis not present

## 2015-02-08 DIAGNOSIS — F411 Generalized anxiety disorder: Secondary | ICD-10-CM

## 2015-02-08 DIAGNOSIS — Z86010 Personal history of colonic polyps: Secondary | ICD-10-CM

## 2015-02-08 DIAGNOSIS — L729 Follicular cyst of the skin and subcutaneous tissue, unspecified: Secondary | ICD-10-CM | POA: Diagnosis not present

## 2015-02-08 MED ORDER — FLUOXETINE HCL 20 MG PO TABS: 20.0000 mg | ORAL_TABLET | Freq: Every day | ORAL | Status: DC

## 2015-02-08 MED ORDER — PRAVASTATIN SODIUM 40 MG PO TABS: 40.0000 mg | ORAL_TABLET | Freq: Every day | ORAL | Status: DC

## 2015-02-08 NOTE — Progress Notes (Signed)
Subjective:    Patient ID: Kelli Mclean, female    DOB: Jun 10, 1956, 59 y.o.   MRN: [id number]  02/08/2015  Follow-up; Mass; Anxiety; and Hyperlipidemia   HPI This 59 y.o. female presents for three month follow-up:  1.  R buttocks lump: thinks fatty deposit.  Duration years but has recently grown in past year.  Scratched area once in the past with excessive bleeding; no recurrent bleeding; no scaling.  2.  Hyperlipidemia: management changes made at last visit included increasing Simvastatin to 46m daily.  Legs are little heavier with increase to 461mdaily.  Vulnerable to side effects to medications.  No other medications/statins in past.  Brothers take Pravastatin.  Mother also takes Pravastatin.  [facility].  Non-fasting today.    3.  Anxiety:  Anxiety is a lot better with starting Prozac 2022maily.  Took four weeks to improve anxiety.  Less dread every morning to go to work and to start day; edge has been taken off.  50% improved.  No side effects to medication.  Son getting married in early April; planning rehearsal dinner and doing flowers for wedding; stressful.    4.  Nipple discharge: still present; less now.  S/p ductogram; s/p two attempts of biopsy; scheduled for ductal resection by general surgery in upcoming month.   5. Colon polyps: s/p repeat colonoscopy since last visit.  [facility]ie.  Pre-cancerous polyps.  Five years repeat.    Review of Systems  Constitutional: Negative for fever, chills, diaphoresis and fatigue.  Eyes: Negative for visual disturbance.  Respiratory: Negative for cough and shortness of breath.   Cardiovascular: Negative for chest pain, palpitations and leg swelling.  Gastrointestinal: Negative for nausea, vomiting, abdominal pain, diarrhea and constipation.  Endocrine: Negative for cold intolerance, heat intolerance, polydipsia, polyphagia and polyuria.  Skin: Positive for color change. Negative for pallor, rash and wound.  Neurological: Negative  for dizziness, tremors, seizures, syncope, facial asymmetry, speech difficulty, weakness, light-headedness, numbness and headaches.  Psychiatric/Behavioral: Negative for suicidal ideas, sleep disturbance, self-injury and dysphoric mood. The patient is nervous/anxious.     Past Medical History  Diagnosis Date  . Hyperlipidemia   . Anxiety   . Cancer    Past Surgical History  Procedure Laterality Date  . Abdominal hysterectomy      uterine cancer   No Known Allergies Current Outpatient Prescriptions  Medication Sig Dispense Refill  . ALPRAZolam (XANAX) 0.25 MG tablet Take 1 tablet (0.25 mg total) by mouth daily as needed for anxiety. 20 tablet 1  . aspirin 81 MG tablet Take 81 mg by mouth daily.    . cholecalciferol (VITAMIN D) 1000 UNITS tablet Take 1,000 Units by mouth daily.    . CMarland KitchenNNAMON PO Take by mouth.    . fish oil-omega-3 fatty acids 1000 MG capsule Take 1 g by mouth daily.    . FMarland KitchenUoxetine (PROZAC) 20 MG tablet Take 1-2 tablets (20-40 mg total) by mouth daily. 180 tablet 3  . GLUCOSAMINE CHONDROITIN COMPLX PO Take by mouth. Taking 2 daily    . hydrocortisone (ANUSOL-HC) 25 MG suppository Place 1 suppository (25 mg total) rectally 2 (two) times daily. 12 suppository 2  . ibuprofen (ADVIL,MOTRIN) 600 MG tablet as needed.    . Multiple Vitamins-Minerals (MULTIVITAMIN WITH MINERALS) tablet Take 1 tablet by mouth daily.    . simvastatin (ZOCOR) 40 MG tablet Take 1 tablet (40 mg total) by mouth every evening. 90 tablet 3  . pravastatin (PRAVACHOL) 40 MG tablet Take 1  tablet (40 mg total) by mouth daily. 90 tablet 3   Current Facility-Administered Medications  Medication Dose Route Frequency Provider Last Rate Last Dose  . aspirin tablet 325 mg  325 mg Oral Daily Orma Flaming, MD           Objective:    BP 122/74 mmHg  Pulse 74  Temp(Src) 98.4 F (36.9 C) (Oral)  Resp 16  Ht 5' 2" (1.575 m)  Wt 120 lb 9.6 oz (54.704 kg)  BMI 22.05 kg/m2  SpO2 98% Physical Exam    Constitutional: She is oriented to person, place, and time. She appears well-developed and well-nourished. No distress.  HENT:  Head: Normocephalic and atraumatic.  Eyes: Conjunctivae are normal. Pupils are equal, round, and reactive to light.  Neck: Normal range of motion. Neck supple.  Cardiovascular: Normal rate, regular rhythm and normal heart sounds.  Exam reveals no gallop and no friction rub.   No murmur heard. Pulmonary/Chest: Effort normal and breath sounds normal. She has no wheezes. She has no rales.  Abdominal: Soft. Bowel sounds are normal. She exhibits no distension and no mass. There is no tenderness. There is no rebound and no guarding.  Neurological: She is alert and oriented to person, place, and time.  Skin: Skin is warm and dry. She is not diaphoretic.  R buttocks with 3 mm diameter cystic lesion in subcutaneous area.  Psychiatric: She has a normal mood and affect. Her behavior is normal.  Nursing note and vitals reviewed.  Results for orders placed or performed in visit on 11/18/14  CBC with Differential  Result Value Ref Range   WBC 4.5 4.0 - 10.5 K/uL   RBC 4.78 3.87 - 5.11 MIL/uL   Hemoglobin 15.1 (H) 12.0 - 15.0 g/dL   HCT 43.9 36.0 - 46.0 %   MCV 91.8 78.0 - 100.0 fL   MCH 31.6 26.0 - 34.0 pg   MCHC 34.4 30.0 - 36.0 g/dL   RDW 13.8 11.5 - 15.5 %   Platelets 200 150 - 400 K/uL   MPV 11.4 9.4 - 12.4 fL   Neutrophils Relative % 59 43 - 77 %   Neutro Abs 2.7 1.7 - 7.7 K/uL   Lymphocytes Relative 31 12 - 46 %   Lymphs Abs 1.4 0.7 - 4.0 K/uL   Monocytes Relative 7 3 - 12 %   Monocytes Absolute 0.3 0.1 - 1.0 K/uL   Eosinophils Relative 2 0 - 5 %   Eosinophils Absolute 0.1 0.0 - 0.7 K/uL   Basophils Relative 1 0 - 1 %   Basophils Absolute 0.0 0.0 - 0.1 K/uL   Smear Review Criteria for review not met   Comprehensive metabolic panel  Result Value Ref Range   Sodium 141 135 - 145 mEq/L   Potassium 4.3 3.5 - 5.3 mEq/L   Chloride 105 96 - 112 mEq/L   CO2 28  19 - 32 mEq/L   Glucose, Bld 90 70 - 99 mg/dL   BUN 19 6 - 23 mg/dL   Creat 0.75 0.50 - 1.10 mg/dL   Total Bilirubin 0.8 0.2 - 1.2 mg/dL   Alkaline Phosphatase 93 39 - 117 U/L   AST 19 0 - 37 U/L   ALT 17 0 - 35 U/L   Total Protein 6.8 6.0 - 8.3 g/dL   Albumin 4.5 3.5 - 5.2 g/dL   Calcium 9.6 8.4 - 10.5 mg/dL  Hemoglobin A1c  Result Value Ref Range   Hgb A1c MFr Bld  5.5 <5.7 %   Mean Plasma Glucose 111 <117 mg/dL  Lipid panel  Result Value Ref Range   Cholesterol 212 (H) 0 - 200 mg/dL   Triglycerides 80 <150 mg/dL   HDL 70 >39 mg/dL   Total CHOL/HDL Ratio 3.0 Ratio   VLDL 16 0 - 40 mg/dL   LDL Cholesterol 126 (H) 0 - 99 mg/dL  TSH  Result Value Ref Range   TSH 1.225 0.350 - 4.500 uIU/mL  Pap IG (Image Guided)  Result Value Ref Range   Specimen adequacy: SEE NOTE    FINAL DIAGNOSIS: SEE NOTE    COMMENTS: SEE NOTE    Cytotechnologist: SEE NOTE        Assessment & Plan:   1. Generalized anxiety disorder   2. Hyperlipidemia   3. History of colonic polyps   4. Nipple discharge   5. Benign skin cyst     1. Generalized anxiety disorder: improved with Prozac 57m daily; trial of Prozac 348mdaily over next three months.  2.  Hyperlipidemia: uncontrolled; suffering with leg heaviness with Simvastatin 4067maily; switch to Pravastatin 79m48mily which several family members tolerate well.  Obtain labs next visit. 3.  Colon polyps: s/p repeat colonoscopy with polyps.  Repeat in five years. 4.  Benign skin cyst R buttocks: New. Reassurance provided.  RTC for increasing pain or rapid growth. 5.  Nipple Discharge: persistent; scheduled for ductal resection by general surgery; has failed two attempts of ductal biopsy.     Meds ordered this encounter  Medications  . CINNAMON PO    Sig: Take by mouth.  . pravastatin (PRAVACHOL) 40 MG tablet    Sig: Take 1 tablet (40 mg total) by mouth daily.    Dispense:  90 tablet    Refill:  3  . FLUoxetine (PROZAC) 20 MG tablet    Sig:  Take 1-2 tablets (20-40 mg total) by mouth daily.    Dispense:  180 tablet    Refill:  3    Return in about 3 months (around 05/11/2015) for recheck cholesterol, anxiety.     Kristi MartElayne GuerinD. [facility] [address]  [phone]

## 2015-02-08 NOTE — Patient Instructions (Signed)
Generalized Anxiety Disorder Generalized anxiety disorder (GAD) is a mental disorder. It interferes with life functions, including relationships, work, and school. GAD is different from normal anxiety, which everyone experiences at some point in their lives in response to specific life events and activities. Normal anxiety actually helps us prepare for and get through these life events and activities. Normal anxiety goes away after the event or activity is over.  GAD causes anxiety that is not necessarily related to specific events or activities. It also causes excess anxiety in proportion to specific events or activities. The anxiety associated with GAD is also difficult to control. GAD can vary from mild to severe. People with severe GAD can have intense waves of anxiety with physical symptoms (panic attacks).  SYMPTOMS The anxiety and worry associated with GAD are difficult to control. This anxiety and worry are related to many life events and activities and also occur more days than not for 6 months or longer. People with GAD also have three or more of the following symptoms (one or more in children):  Restlessness.   Fatigue.  Difficulty concentrating.   Irritability.  Muscle tension.  Difficulty sleeping or unsatisfying sleep. DIAGNOSIS GAD is diagnosed through an assessment by your health care provider. Your health care provider will ask you questions aboutyour mood,physical symptoms, and events in your life. Your health care provider may ask you about your medical history and use of alcohol or drugs, including prescription medicines. Your health care provider may also do a physical exam and blood tests. Certain medical conditions and the use of certain substances can cause symptoms similar to those associated with GAD. Your health care provider may refer you to a mental health specialist for further evaluation. TREATMENT The following therapies are usually used to treat GAD:    Medication. Antidepressant medication usually is prescribed for long-term daily control. Antianxiety medicines may be added in severe cases, especially when panic attacks occur.   Talk therapy (psychotherapy). Certain types of talk therapy can be helpful in treating GAD by providing support, education, and guidance. A form of talk therapy called cognitive behavioral therapy can teach you healthy ways to think about and react to daily life events and activities.  Stress managementtechniques. These include yoga, meditation, and exercise and can be very helpful when they are practiced regularly. A mental health specialist can help determine which treatment is best for you. Some people see improvement with one therapy. However, other people require a combination of therapies. Document Released: 03/17/2013 Document Revised: 04/06/2014 Document Reviewed: 03/17/2013 [employer] Patient Information 2015 [employer]. This information is not intended to replace advice given to you by your health care provider. Make sure you discuss any questions you have with your health care provider.  

## 2015-03-20 ENCOUNTER — Encounter: Payer: Self-pay | Admitting: Family Medicine

## 2015-05-17 ENCOUNTER — Ambulatory Visit: Payer: BLUE CROSS/BLUE SHIELD | Admitting: Family Medicine

## 2015-05-28 ENCOUNTER — Ambulatory Visit (INDEPENDENT_AMBULATORY_CARE_PROVIDER_SITE_OTHER): Payer: BLUE CROSS/BLUE SHIELD | Admitting: Family Medicine

## 2015-05-28 ENCOUNTER — Encounter: Payer: Self-pay | Admitting: Family Medicine

## 2015-05-28 VITALS — BP 125/74 | HR 70 | Temp 98.3°F | Resp 16 | Ht 62.0 in | Wt 120.2 lb

## 2015-05-28 DIAGNOSIS — E785 Hyperlipidemia, unspecified: Secondary | ICD-10-CM

## 2015-05-28 DIAGNOSIS — F418 Other specified anxiety disorders: Secondary | ICD-10-CM

## 2015-05-28 DIAGNOSIS — F419 Anxiety disorder, unspecified: Secondary | ICD-10-CM

## 2015-05-28 DIAGNOSIS — N6452 Nipple discharge: Secondary | ICD-10-CM | POA: Diagnosis not present

## 2015-05-28 DIAGNOSIS — L659 Nonscarring hair loss, unspecified: Secondary | ICD-10-CM

## 2015-05-28 DIAGNOSIS — Z23 Encounter for immunization: Secondary | ICD-10-CM

## 2015-05-28 DIAGNOSIS — F329 Major depressive disorder, single episode, unspecified: Secondary | ICD-10-CM

## 2015-05-28 LAB — CBC WITH DIFFERENTIAL/PLATELET
Basophils Absolute: 0 10*3/uL (ref 0.0–0.1)
Basophils Relative: 0 % (ref 0–1)
EOS ABS: 0.1 10*3/uL (ref 0.0–0.7)
Eosinophils Relative: 2 % (ref 0–5)
HEMATOCRIT: 40.8 % (ref 36.0–46.0)
Hemoglobin: 13.9 g/dL (ref 12.0–15.0)
Lymphocytes Relative: 27 % (ref 12–46)
Lymphs Abs: 1.2 10*3/uL (ref 0.7–4.0)
MCH: 31.3 pg (ref 26.0–34.0)
MCHC: 34.1 g/dL (ref 30.0–36.0)
MCV: 91.9 fL (ref 78.0–100.0)
MONO ABS: 0.4 10*3/uL (ref 0.1–1.0)
MONOS PCT: 8 % (ref 3–12)
MPV: 10.9 fL (ref 8.6–12.4)
Neutro Abs: 2.9 10*3/uL (ref 1.7–7.7)
Neutrophils Relative %: 63 % (ref 43–77)
Platelets: 182 10*3/uL (ref 150–400)
RBC: 4.44 MIL/uL (ref 3.87–5.11)
RDW: 13.5 % (ref 11.5–15.5)
WBC: 4.6 10*3/uL (ref 4.0–10.5)

## 2015-05-28 LAB — COMPREHENSIVE METABOLIC PANEL
ALBUMIN: 4.4 g/dL (ref 3.5–5.2)
ALT: 21 U/L (ref 0–35)
AST: 17 U/L (ref 0–37)
Alkaline Phosphatase: 94 U/L (ref 39–117)
BUN: 17 mg/dL (ref 6–23)
CALCIUM: 9.2 mg/dL (ref 8.4–10.5)
CHLORIDE: 106 meq/L (ref 96–112)
CO2: 28 meq/L (ref 19–32)
CREATININE: 0.78 mg/dL (ref 0.50–1.10)
Glucose, Bld: 92 mg/dL (ref 70–99)
Potassium: 4.6 mEq/L (ref 3.5–5.3)
Sodium: 142 mEq/L (ref 135–145)
TOTAL PROTEIN: 6.7 g/dL (ref 6.0–8.3)
Total Bilirubin: 0.9 mg/dL (ref 0.2–1.2)

## 2015-05-28 LAB — LIPID PANEL
Cholesterol: 213 mg/dL — ABNORMAL HIGH (ref 0–200)
HDL: 73 mg/dL (ref >=46)
LDL CALC: 128 mg/dL — AB (ref 0–99)
Total CHOL/HDL Ratio: 2.9 Ratio
Triglycerides: 60 mg/dL (ref <150)
VLDL: 12 mg/dL (ref 0–40)

## 2015-05-28 MED ORDER — ZOSTER VACCINE LIVE 19400 UNT/0.65ML ~~LOC~~ SOLR: 0.6500 mL | Freq: Once | SUBCUTANEOUS | Status: DC

## 2015-05-28 NOTE — Progress Notes (Signed)
Subjective:    Patient ID: Kelli Mclean, female    DOB: 1956/06/15, 59 y.o.   MRN: [id number]  05/28/2015  Anxiety; Hyperlipidemia; and Alopecia   HPI This 59 y.o. female presents for three month follow-up:  1. Anxiety and depression:  Increased $RemoveBe'30mg'ZIeAcNDDb$  Prozac at last visit.  Did not increase Prozac to 30 mg; kept at $Remov'20mg'DlQuzl$  daily.    2.  Hyperlipidemia:  Switched to Pravastatin at last visit.  3. Alopecia: onset three weeks ago.  Frontal region and now all over.  Bald spots.  Thinning everywhere.  Complete hysterectomy at 54.  Colors hair every 3-4 weeks; chronic.  During wedding more frequent.  No scalp itching or scaling; afraid to wash it.    4.  Breast leakage R: decreased.     Review of Systems  Constitutional: Negative for fever, chills, diaphoresis and fatigue.  Eyes: Negative for visual disturbance.  Respiratory: Negative for cough and shortness of breath.   Cardiovascular: Negative for chest pain, palpitations and leg swelling.  Gastrointestinal: Negative for nausea, vomiting, abdominal pain, diarrhea and constipation.  Endocrine: Negative for cold intolerance, heat intolerance, polydipsia, polyphagia and polyuria.  Neurological: Negative for dizziness, tremors, seizures, syncope, facial asymmetry, speech difficulty, weakness, light-headedness, numbness and headaches.    Past Medical History  Diagnosis Date  . Hyperlipidemia   . Anxiety   . Uterine cancer 12/04/2010    s/p hysterectomy;  [facility].   Past Surgical History  Procedure Laterality Date  . Abdominal hysterectomy  12/04/2010    uterine cancer; B oophorectomy.   No Known Allergies Current Outpatient Prescriptions  Medication Sig Dispense Refill  . ALPRAZolam (XANAX) 0.25 MG tablet Take 1 tablet (0.25 mg total) by mouth daily as needed for anxiety. 20 tablet 1  . aspirin 81 MG tablet Take 81 mg by mouth daily.    . cholecalciferol (VITAMIN D) 1000 UNITS tablet Take 1,000 Units by mouth daily.    Marland Kitchen  CINNAMON PO Take by mouth.    Marland Kitchen FLUoxetine (PROZAC) 20 MG tablet Take 1-2 tablets (20-40 mg total) by mouth daily. 180 tablet 3  . hydrocortisone (ANUSOL-HC) 25 MG suppository Place 1 suppository (25 mg total) rectally 2 (two) times daily. 12 suppository 2  . ibuprofen (ADVIL,MOTRIN) 600 MG tablet as needed.    . Multiple Vitamins-Minerals (MULTIVITAMIN WITH MINERALS) tablet Take 1 tablet by mouth daily.    . pravastatin (PRAVACHOL) 40 MG tablet Take 1 tablet (40 mg total) by mouth daily. 90 tablet 3  . fish oil-omega-3 fatty acids 1000 MG capsule Take 1 g by mouth daily.    Marland Kitchen GLUCOSAMINE CHONDROITIN COMPLX PO Take by mouth. Taking 2 daily    . zoster vaccine live, PF, (ZOSTAVAX) 79150 UNT/0.65ML injection Inject 19,400 Units into the skin once. 1 each 0   Current Facility-Administered Medications  Medication Dose Route Frequency Provider Last Rate Last Dose  . aspirin tablet 325 mg  325 mg Oral Daily Orma Flaming, MD           Objective:    BP 125/74 mmHg  Pulse 70  Temp(Src) 98.3 F (36.8 C) (Oral)  Resp 16  Ht $R'5\' 2"'Fj$  (1.575 m)  Wt 120 lb 3.2 oz (54.522 kg)  BMI 21.98 kg/m2  SpO2 99% Physical Exam  Constitutional: She is oriented to person, place, and time. She appears well-developed and well-nourished. No distress.  HENT:  Head: Normocephalic and atraumatic.  Right Ear: External ear normal.  Left Ear: External ear  normal.  Nose: Nose normal.  Mouth/Throat: Oropharynx is clear and moist.  Eyes: Conjunctivae and EOM are normal. Pupils are equal, round, and reactive to light.  Neck: Normal range of motion. Neck supple. Carotid bruit is not present. No thyromegaly present.  Cardiovascular: Normal rate, regular rhythm, normal heart sounds and intact distal pulses.  Exam reveals no gallop and no friction rub.   No murmur heard. Pulmonary/Chest: Effort normal and breath sounds normal. She has no wheezes. She has no rales.  Abdominal: Soft. Bowel sounds are normal. She exhibits no  distension and no mass. There is no tenderness. There is no rebound and no guarding.  Lymphadenopathy:    She has no cervical adenopathy.  Neurological: She is alert and oriented to person, place, and time. No cranial nerve deficit.  Skin: Skin is warm and dry. No rash noted. She is not diaphoretic. No erythema. No pallor.  Psychiatric: She has a normal mood and affect. Her behavior is normal.   Results for orders placed or performed in visit on 05/28/15  CBC with Differential/Platelet  Result Value Ref Range   WBC 4.6 4.0 - 10.5 K/uL   RBC 4.44 3.87 - 5.11 MIL/uL   Hemoglobin 13.9 12.0 - 15.0 g/dL   HCT 40.8 36.0 - 46.0 %   MCV 91.9 78.0 - 100.0 fL   MCH 31.3 26.0 - 34.0 pg   MCHC 34.1 30.0 - 36.0 g/dL   RDW 13.5 11.5 - 15.5 %   Platelets 182 150 - 400 K/uL   MPV 10.9 8.6 - 12.4 fL   Neutrophils Relative % 63 43 - 77 %   Neutro Abs 2.9 1.7 - 7.7 K/uL   Lymphocytes Relative 27 12 - 46 %   Lymphs Abs 1.2 0.7 - 4.0 K/uL   Monocytes Relative 8 3 - 12 %   Monocytes Absolute 0.4 0.1 - 1.0 K/uL   Eosinophils Relative 2 0 - 5 %   Eosinophils Absolute 0.1 0.0 - 0.7 K/uL   Basophils Relative 0 0 - 1 %   Basophils Absolute 0.0 0.0 - 0.1 K/uL   Smear Review Criteria for review not met   Comprehensive metabolic panel  Result Value Ref Range   Sodium 142 135 - 145 mEq/L   Potassium 4.6 3.5 - 5.3 mEq/L   Chloride 106 96 - 112 mEq/L   CO2 28 19 - 32 mEq/L   Glucose, Bld 92 70 - 99 mg/dL   BUN 17 6 - 23 mg/dL   Creat 0.78 0.50 - 1.10 mg/dL   Total Bilirubin 0.9 0.2 - 1.2 mg/dL   Alkaline Phosphatase 94 39 - 117 U/L   AST 17 0 - 37 U/L   ALT 21 0 - 35 U/L   Total Protein 6.7 6.0 - 8.3 g/dL   Albumin 4.4 3.5 - 5.2 g/dL   Calcium 9.2 8.4 - 10.5 mg/dL  Lipid panel  Result Value Ref Range   Cholesterol 213 (H) 0 - 200 mg/dL   Triglycerides 60 <150 mg/dL   HDL 73 >=46 mg/dL   Total CHOL/HDL Ratio 2.9 Ratio   VLDL 12 0 - 40 mg/dL   LDL Cholesterol 128 (H) 0 - 99 mg/dL         Assessment & Plan:   1. Need for shingles vaccine   2. Hyperlipidemia   3. Anxiety and depression   4. Nipple discharge   5. Alopecia     Meds ordered this encounter  Medications  . zoster vaccine  live, PF, (ZOSTAVAX) 71640 UNT/0.65ML injection    Sig: Inject 19,400 Units into the skin once.    Dispense:  1 each    Refill:  0    Return in about 3 months (around 08/28/2015) for recheck.     Avianah Pellman Elayne Guerin, M.D. [facility] [address] [place], [place]  [address] [phone] phone [phone] fax

## 2015-05-28 NOTE — Patient Instructions (Signed)
1. HOLD PRAVASTATIN FOR NEXT THREE MONTHS.

## 2015-06-02 ENCOUNTER — Encounter: Payer: Self-pay | Admitting: Internal Medicine

## 2015-06-14 ENCOUNTER — Ambulatory Visit: Payer: Self-pay | Admitting: Surgery

## 2015-06-14 NOTE — H&P (Signed)
History of Present Illness Kelli Mclean. Kelli Mohammed MD; 06/14/2015 2:09 PM) Patient words: breast f/u.  The patient is a 59 year old female who presents with a complaint of nipple discharge. Referred by Dr. Reginia Forts for evaluation of persistent right nipple discharge.  This is a 59 yo female who presents with several months of persistent clear right nipple discharge. A few months ago, she began noticing some bloody, darker drainage. She underwent a diagnostic mammogram and ultrasound 12/08/14 after a routine screening mammogram in 11/15. These were all negative. She underwent two attempts at a ductogram on 12/08/14 and 12/31/14. 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 She is now referred for surgical evaluation. She was originally seen in February and right nipple duct excision was recommended. The patient failed to schedule the surgery because of other life events that occurred during the last few months.  The patient has a history of uterine cancer s/p TAH/BSO. No adjuvant therapy needed.   CLINICAL DATA: Screening.  EXAM: DIGITAL SCREENING BILATERAL MAMMOGRAM WITH 3D TOMO WITH CAD  COMPARISON: Previous exam(s).  ACR Breast Density Category c: The breast tissue is heterogeneously dense, which may obscure small masses.  FINDINGS: There are no findings suspicious for malignancy. Images were processed with CAD.  IMPRESSION: No mammographic evidence of malignancy. A result letter of this screening mammogram will be mailed directly to the patient.  RECOMMENDATION: Screening mammogram in one year. (Code:SM-B-01Y)  BI-RADS CATEGORY 1: Negative.   Electronically Signed By: Willadean Carol M.D. On: 10/05/2014 19:31  CLINICAL DATA: 59 year old female who reports a several year history of spontaneous clear nipple discharge which has recently become rusty brown in the past several  months.  EXAM: DIGITAL DIAGNOSTIC RIGHT MAMMOGRAM WITH CAD  ULTRASOUND RIGHT BREAST  COMPARISON: 10/05/2014, additional prior imaging dating back to 02/01/2009  ACR Breast Density Category c: The breast tissue is heterogeneously dense, which may obscure small masses.  FINDINGS: No suspicious mass, calcifications, or other abnormality is identified. The breast parenchymal pattern appears stable dating back to at least 2012.  Mammographic images were processed with CAD.  On physical exam, a small amount of clear nipple discharge is expressed from a single duct in the central aspect of the nipple.  Targeted ultrasound of the right retroareolar region demonstrates mild duct ectasia without definite intraductal mass.  IMPRESSION: No mammographic or sonographic explanation is identified for the patient's nipple discharge.  RECOMMENDATION: Further evaluation with right ductogram is recommended.  I have discussed the findings and recommendations with the patient. Results were also provided in writing at the conclusion of the visit. If applicable, a reminder letter will be sent to the patient regarding the next appointment.  BI-RADS CATEGORY 0: Incomplete. Need additional imaging evaluation and/or prior mammograms for comparison.   Electronically Signed By: Pamelia Hoit M.D. On: 12/08/2014 15:42  ADDENDUM REPORT: 12/31/2014 12:38  ADDENDUM: The patient returned on 12/31/2014 for a repeat attempt at a right ductogram. A small amount of clear, tan-colored fluid was elicited from a central orifice in the right nipple. The discharging orifice was cannulated with a ductogram needle. With injection of contrast, there was immediate reflux of contrast out of the nipple orifice. Subsequent spot magnification mammogram images demonstrated a small amount of contrast in a central retronipple duct, identical to that seen on 12/08/2014. No contrast extended more  posteriorly.  IMPRESSION: Obstruction of a central right nipple duct just behind the nipple. 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 An obstructing malignancy is significantly less likely based on the history of spontaneous discharge for several years, but not excluded.  RECOMMENDATION:  Surgical consultation for right duct excision. This has been discussed with the patient. She was given an appointment with Dr. Georgette Dover on 01/11/2015 at 9:30 a.m.  BI-RADS CATEGORY:  4: Suspicious.   Electronically Signed By: Enrique Sack M.D. On: 12/31/2014 12:38 Allergies (Sonya Bynum, CMA; 06/14/2015 11:04 AM) No Known Drug Allergies 01/11/2015  Medication History (Sonya Bynum, CMA; 06/14/2015 11:04 AM) ALPRAZolam (0.25MG  Tablet, Oral) Active. Simvastatin (40MG  Tablet, Oral) Active. FLUoxetine HCl (20MG  Tablet, Oral) Active. Aspirin EC (81MG  Tablet DR, Oral) Active. FLUoxetine HCl (20MG  Capsule, Oral) Active. Fish Oil (1000MG  Capsule DR, Oral) Active. Vitamin D (1000UNIT Capsule, Oral) Active. Medications Reconciled    Vitals (Sonya Bynum CMA; 06/14/2015 11:04 AM) 06/14/2015 11:04 AM Weight: 125 lb Height: 62in Body Surface Area: 1.57 m Body Mass Index: 22.86 kg/m Temp.: 33F(Temporal)  Pulse: 79 (Regular)  BP: 128/80 (Sitting, Left Arm, Standard)     Physical Exam Rodman Key K. Mearl Olver MD; 06/14/2015 11:45 AM)  The physical exam findings are as follows: Note:WDWN in NAD HEENT: EOMI, sclera anicteric Neck: No masses, no thyromegaly Lungs: CTA bilaterally; normal respiratory effort Breasts: symmetric; no palpable masses or lymphadenopathy; clear drainage from center of right nipple CV: Regular rate and rhythm; no murmurs Abd: +bowel sounds, soft, non-tender, no masses Ext: Well-perfused; no edema Skin: Warm, dry; no sign of jaundice    Assessment & Plan Rodman Key K. Rondal Vandevelde MD; 06/14/2015 11:35 AM)  DISCHARGE FROM RIGHT NIPPLE (611.79   N64.52)  Current Plans Schedule for Surgery - Right nipple duct excision. The surgical procedure has been discussed with the patient. Potential risks, benefits, alternative treatments, and expected outcomes have been explained. All of the patient's questions at this time have been answered. The likelihood of reaching the patient's treatment goal is good. The patient understand the proposed surgical procedure and wishes to proceed.  Kelli Mclean. Georgette Dover, MD, [facility] Surgery  General/ Trauma Surgery  06/14/2015 2:11 PM

## 2015-06-14 NOTE — H&P (Signed)
History of Present Illness Kelli Mclean. Kelli Renne MD; 06/14/2015 11:47 AM) Patient words: breast f/u.  The patient is a 59 year old female who presents with a complaint of nipple discharge. Referred by Dr. Reginia Forts for evaluation of persistent right nipple discharge.  This is a 59 yo female who presents with several months of persistent clear right nipple discharge. A few months ago, she began noticing some bloody, darker drainage. She underwent a diagnostic mammogram and ultrasound 12/08/14 after a routine screening mammogram in 11/15. These were all negative. She underwent two attempts at a ductogram on 12/08/14 and 12/31/14. 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 She is now referred for surgical evaluation. She was originally seen in February and right nipple duct excision was recommended. The patient failed to schedule the surgery because of other life events that occurred during the last few months.  The patient has a history of uterine cancer s/p TAH/BSO. No adjuvant therapy needed.   CLINICAL DATA: Screening.  EXAM: DIGITAL SCREENING BILATERAL MAMMOGRAM WITH 3D TOMO WITH CAD  COMPARISON: Previous exam(s).  ACR Breast Density Category c: The breast tissue is heterogeneously dense, which may obscure small masses.  FINDINGS: There are no findings suspicious for malignancy. Images were processed with CAD.  IMPRESSION: No mammographic evidence of malignancy. A result letter of this screening mammogram will be mailed directly to the patient.  RECOMMENDATION: Screening mammogram in one year. (Code:SM-B-01Y)  BI-RADS CATEGORY 1: Negative.   Electronically Signed By: Willadean Carol M.D. On: 10/05/2014 19:31  CLINICAL DATA: 59 year old female who reports a several year history of spontaneous clear nipple discharge which has recently become rusty brown in the past several  months.  EXAM: DIGITAL DIAGNOSTIC RIGHT MAMMOGRAM WITH CAD  ULTRASOUND RIGHT BREAST  COMPARISON: 10/05/2014, additional prior imaging dating back to 02/01/2009  ACR Breast Density Category c: The breast tissue is heterogeneously dense, which may obscure small masses.  FINDINGS: No suspicious mass, calcifications, or other abnormality is identified. The breast parenchymal pattern appears stable dating back to at least 2012.  Mammographic images were processed with CAD.  On physical exam, a small amount of clear nipple discharge is expressed from a single duct in the central aspect of the nipple.  Targeted ultrasound of the right retroareolar region demonstrates mild duct ectasia without definite intraductal mass.  IMPRESSION: No mammographic or sonographic explanation is identified for the patient's nipple discharge.  RECOMMENDATION: Further evaluation with right ductogram is recommended.  I have discussed the findings and recommendations with the patient. Results were also provided in writing at the conclusion of the visit. If applicable, a reminder letter will be sent to the patient regarding the next appointment.  BI-RADS CATEGORY 0: Incomplete. Need additional imaging evaluation and/or prior mammograms for comparison.   Electronically Signed By: Pamelia Hoit M.D. On: 12/08/2014 15:42  ADDENDUM REPORT: 12/31/2014 12:38  ADDENDUM: The patient returned on 12/31/2014 for a repeat attempt at a right ductogram. A small amount of clear, tan-colored fluid was elicited from a central orifice in the right nipple. The discharging orifice was cannulated with a ductogram needle. With injection of contrast, there was immediate reflux of contrast out of the nipple orifice. Subsequent spot magnification mammogram images demonstrated a small amount of contrast in a central retronipple duct, identical to that seen on 12/08/2014. No contrast extended more  posteriorly.  IMPRESSION: Obstruction of a central right nipple duct just behind the nipple. 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 An obstructing malignancy is significantly less likely based on the history of spontaneous discharge for several years, but not excluded.  RECOMMENDATION:  Surgical consultation for right duct excision. This has been discussed with the patient. She was given an appointment with Dr. Georgette Dover on 01/11/2015 at 9:30 a.m.  BI-RADS CATEGORY:  4: Suspicious.   Electronically Signed By: Enrique Sack M.D. On: 12/31/2014 12:38 Allergies (Sonya Bynum, CMA; 06/14/2015 11:04 AM) No Known Drug Allergies 01/11/2015  Medication History (Sonya Bynum, CMA; 06/14/2015 11:04 AM) ALPRAZolam (0.25MG  Tablet, Oral) Active. Simvastatin (40MG  Tablet, Oral) Active. FLUoxetine HCl (20MG  Tablet, Oral) Active. Aspirin EC (81MG  Tablet DR, Oral) Active. FLUoxetine HCl (20MG  Capsule, Oral) Active. Fish Oil (1000MG  Capsule DR, Oral) Active. Vitamin D (1000UNIT Capsule, Oral) Active. Medications Reconciled    Vitals (Sonya Bynum CMA; 06/14/2015 11:04 AM) 06/14/2015 11:04 AM Weight: 125 lb Height: 62in Body Surface Area: 1.57 m Body Mass Index: 22.86 kg/m Temp.: 53F(Temporal)  Pulse: 79 (Regular)  BP: 128/80 (Sitting, Left Arm, Standard)     Physical Exam Rodman Key K. Andromeda Poppen MD; 06/14/2015 11:45 AM)  The physical exam findings are as follows: Note:WDWN in NAD HEENT: EOMI, sclera anicteric Neck: No masses, no thyromegaly Lungs: CTA bilaterally; normal respiratory effort Breasts: symmetric; no palpable masses or lymphadenopathy; clear drainage from center of right nipple CV: Regular rate and rhythm; no murmurs Abd: +bowel sounds, soft, non-tender, no masses Ext: Well-perfused; no edema Skin: Warm, dry; no sign of jaundice    Assessment & Plan Rodman Key K. Kierrah Kilbride MD; 06/14/2015 11:35 AM)  DISCHARGE FROM RIGHT NIPPLE (611.79   N64.52)  Current Plans Schedule for Surgery - Right nipple duct excision. The surgical procedure has been discussed with the patient. Potential risks, benefits, alternative treatments, and expected outcomes have been explained. All of the patient's questions at this time have been answered. The likelihood of reaching the patient's treatment goal is good. The patient understand the proposed surgical procedure and wishes to proceed.  Kelli Mclean. Georgette Dover, MD, [facility] Surgery  General/ Trauma Surgery  06/14/2015 11:47 AM

## 2015-07-28 ENCOUNTER — Encounter (HOSPITAL_COMMUNITY)
Admission: RE | Admit: 2015-07-28 | Discharge: 2015-07-28 | Disposition: A | Discharge status: Home / Self Care | Payer: BLUE CROSS/BLUE SHIELD | Source: Ambulatory Visit | Attending: Surgery | Admitting: Surgery

## 2015-07-28 ENCOUNTER — Encounter (HOSPITAL_COMMUNITY): Payer: Self-pay

## 2015-07-28 DIAGNOSIS — E785 Hyperlipidemia, unspecified: Secondary | ICD-10-CM | POA: Diagnosis not present

## 2015-07-28 DIAGNOSIS — Z01818 Encounter for other preprocedural examination: Secondary | ICD-10-CM | POA: Insufficient documentation

## 2015-07-28 DIAGNOSIS — N6452 Nipple discharge: Secondary | ICD-10-CM | POA: Insufficient documentation

## 2015-07-28 DIAGNOSIS — Z01812 Encounter for preprocedural laboratory examination: Secondary | ICD-10-CM | POA: Insufficient documentation

## 2015-07-28 DIAGNOSIS — R509 Fever, unspecified: Secondary | ICD-10-CM | POA: Insufficient documentation

## 2015-07-28 DIAGNOSIS — R05 Cough: Secondary | ICD-10-CM

## 2015-07-28 DIAGNOSIS — Z8542 Personal history of malignant neoplasm of other parts of uterus: Secondary | ICD-10-CM | POA: Insufficient documentation

## 2015-07-28 DIAGNOSIS — R059 Cough, unspecified: Secondary | ICD-10-CM

## 2015-07-28 LAB — CBC
HEMATOCRIT: 40.3 % (ref 36.0–46.0)
Hemoglobin: 13.7 g/dL (ref 12.0–15.0)
MCH: 31.9 pg (ref 26.0–34.0)
MCHC: 34 g/dL (ref 30.0–36.0)
MCV: 93.7 fL (ref 78.0–100.0)
PLATELETS: 161 10*3/uL (ref 150–400)
RBC: 4.3 MIL/uL (ref 3.87–5.11)
RDW: 13.4 % (ref 11.5–15.5)
WBC: 4.5 10*3/uL (ref 4.0–10.5)

## 2015-07-28 LAB — BASIC METABOLIC PANEL
Anion gap: 8 (ref 5–15)
BUN: 14 mg/dL (ref 6–20)
CALCIUM: 9 mg/dL (ref 8.9–10.3)
CO2: 28 mmol/L (ref 22–32)
CREATININE: 0.93 mg/dL (ref 0.44–1.00)
Chloride: 104 mmol/L (ref 101–111)
GFR calc Af Amer: >60 mL/min (ref >60)
GLUCOSE: 93 mg/dL (ref 65–99)
Potassium: 4.1 mmol/L (ref 3.5–5.1)
Sodium: 140 mmol/L (ref 135–145)

## 2015-07-28 NOTE — Pre-Procedure Instructions (Signed)
Kelli Mclean  07/28/2015      [id number] - [place], [place] [address] AT [facility] [address] [place] [address] Phone: [phone] Fax: [phone]  [place], [address] [place] [address] Phone: [phone] Fax: [phone]    Your procedure is scheduled on  Wednesday  08/04/15  Report to [facility] Admitting at [place].M.  Call this number if you have problems the morning of surgery:  [phone]   Remember:  Do not eat food or drink liquids after midnight.  Take these medicines the morning of surgery with A SIP OF WATER  ALPRAZOLAM (XANAX), FLUOXETINE(PROZAC) (STOP ASPIRIN, COUMADIN, PLAVIX, EFFIENT, HERBAL MEDICINES)   Do not wear jewelry, make-up or nail polish.  Do not wear lotions, powders, or perfumes.  You may wear deodorant.  Do not shave 48 hours prior to surgery.  Men may shave face and neck.  Do not bring valuables to the hospital.  [facility] is not responsible for any belongings or valuables.  Contacts, dentures or bridgework may not be worn into surgery.  Leave your suitcase in the car.  After surgery it may be brought to your room.  For patients admitted to the hospital, discharge time will be determined by your treatment team.  Patients discharged the day of surgery will not be allowed to drive home.   Name and phone number of your driver:   Special instructions:  [place] - Preparing for Surgery  Before surgery, you can play an important role.  Because skin is not sterile, your skin needs to be as free of germs as possible.  You can reduce the number of germs on you skin by washing with CHG (chlorahexidine gluconate) soap before surgery.  CHG is an antiseptic cleaner which kills germs and bonds with the skin to continue killing germs even after washing.  Please DO NOT use if you have an allergy to CHG or  antibacterial soaps.  If your skin becomes reddened/irritated stop using the CHG and inform your nurse when you arrive at Short Stay.  Do not shave (including legs and underarms) for at least 48 hours prior to the first CHG shower.  You may shave your face.  Please follow these instructions carefully:   1.  Shower with CHG Soap the night before surgery and the                                morning of Surgery.  2.  If you choose to wash your hair, wash your hair first as usual with your       normal shampoo.  3.  After you shampoo, rinse your hair and body thoroughly to remove the                      Shampoo.  4.  Use CHG as you would any other liquid soap.  You can apply chg directly       to the skin and wash gently with scrungie or a clean washcloth.  5.  Apply the CHG Soap to your body ONLY FROM THE NECK DOWN.        Do not use on open wounds or open sores.  Avoid contact with your eyes,  ears, mouth and genitals (private parts).  Wash genitals (private parts)       with your normal soap.  6.  Wash thoroughly, paying special attention to the area where your surgery        will be performed.  7.  Thoroughly rinse your body with warm water from the neck down.  8.  DO NOT shower/wash with your normal soap after using and rinsing off       the CHG Soap.  9.  Pat yourself dry with a clean towel.            10.  Wear clean pajamas.            11.  Place clean sheets on your bed the night of your first shower and do not        sleep with pets.  Day of Surgery  Do not apply any lotions/deoderants the morning of surgery.  Please wear clean clothes to the hospital/surgery center.    Please read over the following fact sheets that you were given. Pain Booklet, Coughing and Deep Breathing, Blood Transfusion Information and MRSA Information, SURGICAL SITE INFECTIONS

## 2015-07-28 NOTE — Progress Notes (Signed)
Spoke with Myra Gianotti re: patient stating she has head cold now with cough, yellow sputum, low grade fever after just returning from vacation in [place]. Cxr done as requested Per Ebony Hail.

## 2015-07-29 NOTE — Progress Notes (Signed)
Anesthesia Chart Review:  Pt is 59 year old female scheduled for R nipple duct excision on 08/04/2015 with Dr. Georgette Dover.   PMH includes: hyperlipidemia, uterine cancer. S/p abdominal hysterectomy 2012. Never smoker. BMI 23  Pt reports head cold with cough, yellow sputum, low grade fever at PAT.   Preoperative labs reviewed.    Chest x-ray 07/28/2015 reviewed. Hyperinflation with coarse interstitial lung markings may reflect underlying reactive airway disease or acute bronchitis. There is no pneumonia nor other acute cardiopulmonary abnormality.  Notified Abigail Butts in Dr. Vonna Kotyk office of pt's symptoms and CXR results.   Pt will need evaluation DOS by her assigned anesthesiologist to ensure her illness has resolved.   Willeen Cass, FNP-BC [facility] Short Stay [facility]/Anesthesiology Phone: [phone] 07/29/2015 2:13 PM

## 2015-08-03 MED ORDER — CEFAZOLIN SODIUM-DEXTROSE 2-3 GM-% IV SOLR
2.0000 g | INTRAVENOUS | Status: AC
  Administered 2015-08-04: 2 g via INTRAVENOUS
  Filled 2015-08-03: qty 50

## 2015-08-03 MED ORDER — CHLORHEXIDINE GLUCONATE 4 % EX LIQD: 1.0000 "application " | Freq: Once | CUTANEOUS | Status: DC

## 2015-08-04 ENCOUNTER — Ambulatory Visit (HOSPITAL_COMMUNITY): Payer: BLUE CROSS/BLUE SHIELD | Admitting: Emergency Medicine

## 2015-08-04 ENCOUNTER — Encounter (HOSPITAL_COMMUNITY): Payer: Self-pay | Admitting: Certified Registered Nurse Anesthetist

## 2015-08-04 ENCOUNTER — Encounter (HOSPITAL_COMMUNITY)
Admission: RE | Disposition: A | Discharge status: Home / Self Care | Payer: Self-pay | Source: Ambulatory Visit | Attending: Surgery

## 2015-08-04 ENCOUNTER — Ambulatory Visit (HOSPITAL_COMMUNITY): Payer: BLUE CROSS/BLUE SHIELD | Admitting: Certified Registered"

## 2015-08-04 ENCOUNTER — Ambulatory Visit (HOSPITAL_COMMUNITY)
Admission: RE | Admit: 2015-08-04 | Discharge: 2015-08-04 | Disposition: A | Discharge status: Home / Self Care | Payer: BLUE CROSS/BLUE SHIELD | Source: Ambulatory Visit | Attending: Surgery | Admitting: Surgery

## 2015-08-04 DIAGNOSIS — F419 Anxiety disorder, unspecified: Secondary | ICD-10-CM | POA: Insufficient documentation

## 2015-08-04 DIAGNOSIS — N6452 Nipple discharge: Secondary | ICD-10-CM | POA: Diagnosis present

## 2015-08-04 DIAGNOSIS — Z90710 Acquired absence of both cervix and uterus: Secondary | ICD-10-CM | POA: Insufficient documentation

## 2015-08-04 DIAGNOSIS — D241 Benign neoplasm of right breast: Secondary | ICD-10-CM | POA: Insufficient documentation

## 2015-08-04 DIAGNOSIS — Z79899 Other long term (current) drug therapy: Secondary | ICD-10-CM | POA: Diagnosis not present

## 2015-08-04 DIAGNOSIS — Z8542 Personal history of malignant neoplasm of other parts of uterus: Secondary | ICD-10-CM | POA: Diagnosis not present

## 2015-08-04 DIAGNOSIS — Z7982 Long term (current) use of aspirin: Secondary | ICD-10-CM | POA: Insufficient documentation

## 2015-08-04 HISTORY — PX: BREAST EXCISIONAL BIOPSY: SUR124

## 2015-08-04 HISTORY — PX: BREAST DUCTAL SYSTEM EXCISION: SHX5242

## 2015-08-04 SURGERY — EXCISION DUCTAL SYSTEM BREAST
Anesthesia: General | Site: Breast | Laterality: Right

## 2015-08-04 MED ORDER — DEXAMETHASONE SODIUM PHOSPHATE 4 MG/ML IJ SOLN
INTRAMUSCULAR | Status: DC | PRN
  Administered 2015-08-04: 4 mg via INTRAVENOUS

## 2015-08-04 MED ORDER — STERILE WATER FOR INJECTION IJ SOLN
INTRAMUSCULAR | Status: AC
  Filled 2015-08-04: qty 10

## 2015-08-04 MED ORDER — BUPIVACAINE HCL (PF) 0.25 % IJ SOLN
INTRAMUSCULAR | Status: AC
  Filled 2015-08-04: qty 30

## 2015-08-04 MED ORDER — ONDANSETRON HCL 4 MG/2ML IJ SOLN: 4.0000 mg | Freq: Once | INTRAMUSCULAR | Status: DC | PRN

## 2015-08-04 MED ORDER — PROPOFOL 10 MG/ML IV BOLUS
INTRAVENOUS | Status: DC | PRN
  Administered 2015-08-04: 200 mg via INTRAVENOUS

## 2015-08-04 MED ORDER — MEPERIDINE HCL 25 MG/ML IJ SOLN: 6.2500 mg | INTRAMUSCULAR | Status: DC | PRN

## 2015-08-04 MED ORDER — DEXAMETHASONE SODIUM PHOSPHATE 4 MG/ML IJ SOLN
INTRAMUSCULAR | Status: AC
  Filled 2015-08-04: qty 1

## 2015-08-04 MED ORDER — 0.9 % SODIUM CHLORIDE (POUR BTL) OPTIME
TOPICAL | Status: DC | PRN
  Administered 2015-08-04: 1000 mL

## 2015-08-04 MED ORDER — MORPHINE SULFATE (PF) 2 MG/ML IV SOLN: 2.0000 mg | INTRAVENOUS | Status: DC | PRN

## 2015-08-04 MED ORDER — LIDOCAINE HCL (CARDIAC) 20 MG/ML IV SOLN
INTRAVENOUS | Status: AC
  Filled 2015-08-04: qty 5

## 2015-08-04 MED ORDER — MIDAZOLAM HCL 2 MG/2ML IJ SOLN
INTRAMUSCULAR | Status: AC
  Filled 2015-08-04: qty 4

## 2015-08-04 MED ORDER — PROPOFOL 10 MG/ML IV BOLUS
INTRAVENOUS | Status: AC
  Filled 2015-08-04: qty 20

## 2015-08-04 MED ORDER — PHENYLEPHRINE HCL 10 MG/ML IJ SOLN
INTRAMUSCULAR | Status: DC | PRN
  Administered 2015-08-04 (×2): 40 ug via INTRAVENOUS

## 2015-08-04 MED ORDER — MIDAZOLAM HCL 5 MG/5ML IJ SOLN
INTRAMUSCULAR | Status: DC | PRN
  Administered 2015-08-04: 2 mg via INTRAVENOUS

## 2015-08-04 MED ORDER — ROCURONIUM BROMIDE 50 MG/5ML IV SOLN
INTRAVENOUS | Status: AC
  Filled 2015-08-04: qty 1

## 2015-08-04 MED ORDER — GLYCOPYRROLATE 0.2 MG/ML IJ SOLN
INTRAMUSCULAR | Status: AC
  Filled 2015-08-04: qty 1

## 2015-08-04 MED ORDER — EPHEDRINE SULFATE 50 MG/ML IJ SOLN
INTRAMUSCULAR | Status: AC
  Filled 2015-08-04: qty 1

## 2015-08-04 MED ORDER — ONDANSETRON HCL 4 MG/2ML IJ SOLN
INTRAMUSCULAR | Status: AC
  Filled 2015-08-04: qty 2

## 2015-08-04 MED ORDER — HYDROCODONE-ACETAMINOPHEN 5-325 MG PO TABS: 1.0000 | ORAL_TABLET | ORAL | Status: DC | PRN

## 2015-08-04 MED ORDER — PHENYLEPHRINE 40 MCG/ML (10ML) SYRINGE FOR IV PUSH (FOR BLOOD PRESSURE SUPPORT)
PREFILLED_SYRINGE | INTRAVENOUS | Status: AC
  Filled 2015-08-04: qty 10

## 2015-08-04 MED ORDER — HYDROMORPHONE HCL 1 MG/ML IJ SOLN
INTRAMUSCULAR | Status: AC
  Filled 2015-08-04: qty 1

## 2015-08-04 MED ORDER — HYDROMORPHONE HCL 1 MG/ML IJ SOLN
0.2500 mg | INTRAMUSCULAR | Status: DC | PRN
  Administered 2015-08-04 (×2): 0.5 mg via INTRAVENOUS

## 2015-08-04 MED ORDER — BUPIVACAINE HCL (PF) 0.25 % IJ SOLN
INTRAMUSCULAR | Status: DC | PRN
  Administered 2015-08-04: 10 mL

## 2015-08-04 MED ORDER — ONDANSETRON HCL 4 MG/2ML IJ SOLN
INTRAMUSCULAR | Status: DC | PRN
  Administered 2015-08-04: 4 mg via INTRAVENOUS

## 2015-08-04 MED ORDER — LIDOCAINE HCL (CARDIAC) 20 MG/ML IV SOLN
INTRAVENOUS | Status: DC | PRN
  Administered 2015-08-04: 100 mg via INTRAVENOUS

## 2015-08-04 MED ORDER — FENTANYL CITRATE (PF) 250 MCG/5ML IJ SOLN
INTRAMUSCULAR | Status: AC
  Filled 2015-08-04: qty 5

## 2015-08-04 MED ORDER — LACTATED RINGERS IV SOLN
INTRAVENOUS | Status: DC | PRN
  Administered 2015-08-04 (×2): via INTRAVENOUS

## 2015-08-04 MED ORDER — FENTANYL CITRATE (PF) 100 MCG/2ML IJ SOLN
INTRAMUSCULAR | Status: DC | PRN
  Administered 2015-08-04: 100 ug via INTRAVENOUS

## 2015-08-04 MED ORDER — ONDANSETRON HCL 4 MG/2ML IJ SOLN: 4.0000 mg | INTRAMUSCULAR | Status: DC | PRN

## 2015-08-04 MED ORDER — SUCCINYLCHOLINE CHLORIDE 20 MG/ML IJ SOLN
INTRAMUSCULAR | Status: AC
  Filled 2015-08-04: qty 1

## 2015-08-04 MED ORDER — EPHEDRINE SULFATE 50 MG/ML IJ SOLN
INTRAMUSCULAR | Status: DC | PRN
  Administered 2015-08-04 (×4): 5 mg via INTRAVENOUS

## 2015-08-04 SURGICAL SUPPLY — 47 items
APL SKNCLS STERI-STRIP NONHPOA (GAUZE/BANDAGES/DRESSINGS) ×1
BENZOIN TINCTURE PRP APPL 2/3 (GAUZE/BANDAGES/DRESSINGS) ×2 IMPLANT
BLADE SURG 10 STRL SS (BLADE) ×2 IMPLANT
BLADE SURG 15 STRL LF DISP TIS (BLADE) ×1 IMPLANT
BLADE SURG 15 STRL SS (BLADE) ×2
CANISTER SUCTION 2500CC (MISCELLANEOUS) ×2 IMPLANT
CHLORAPREP W/TINT 26ML (MISCELLANEOUS) ×2 IMPLANT
CONT SPEC 4OZ CLIKSEAL STRL BL (MISCELLANEOUS) ×2 IMPLANT
COVER SURGICAL LIGHT HANDLE (MISCELLANEOUS) ×2 IMPLANT
DRAPE PED LAPAROTOMY (DRAPES) ×2 IMPLANT
DRAPE UTILITY XL STRL (DRAPES) ×4 IMPLANT
DRSG TEGADERM 4X4.75 (GAUZE/BANDAGES/DRESSINGS) ×2 IMPLANT
ELECT CAUTERY BLADE 6.4 (BLADE) ×2 IMPLANT
ELECT REM PT RETURN 9FT ADLT (ELECTROSURGICAL) ×2
ELECTRODE REM PT RTRN 9FT ADLT (ELECTROSURGICAL) ×1 IMPLANT
GAUZE SPONGE 2X2 8PLY NS (GAUZE/BANDAGES/DRESSINGS) ×1 IMPLANT
GAUZE SPONGE 2X2 8PLY STRL LF (GAUZE/BANDAGES/DRESSINGS) ×1 IMPLANT
GLOVE BIO SURGEON STRL SZ7 (GLOVE) ×2 IMPLANT
GLOVE BIOGEL PI IND STRL 7.0 (GLOVE) IMPLANT
GLOVE BIOGEL PI IND STRL 7.5 (GLOVE) ×1 IMPLANT
GLOVE BIOGEL PI INDICATOR 7.0 (GLOVE) ×1
GLOVE BIOGEL PI INDICATOR 7.5 (GLOVE) ×3
GLOVE SURG SS PI 7.0 STRL IVOR (GLOVE) ×1 IMPLANT
GLOVE SURG SS PI 7.5 STRL IVOR (GLOVE) ×1 IMPLANT
GOWN STRL REUS W/ TWL LRG LVL3 (GOWN DISPOSABLE) ×3 IMPLANT
GOWN STRL REUS W/TWL LRG LVL3 (GOWN DISPOSABLE) ×6
KIT BASIN OR (CUSTOM PROCEDURE TRAY) ×2 IMPLANT
KIT ROOM TURNOVER OR (KITS) ×2 IMPLANT
NDL HYPO 25GX1X1/2 BEV (NEEDLE) ×1 IMPLANT
NEEDLE HYPO 25GX1X1/2 BEV (NEEDLE) ×2 IMPLANT
NS IRRIG 1000ML POUR BTL (IV SOLUTION) ×2 IMPLANT
PACK SURGICAL SETUP 50X90 (CUSTOM PROCEDURE TRAY) ×2 IMPLANT
PAD ARMBOARD 7.5X6 YLW CONV (MISCELLANEOUS) ×4 IMPLANT
PENCIL BUTTON HOLSTER BLD 10FT (ELECTRODE) ×2 IMPLANT
SPONGE GAUZE 2X2 STER 10/PKG (GAUZE/BANDAGES/DRESSINGS) ×1
SPONGE LAP 4X18 X RAY DECT (DISPOSABLE) ×2 IMPLANT
STRIP CLOSURE SKIN 1/2X4 (GAUZE/BANDAGES/DRESSINGS) ×2 IMPLANT
SUT MON AB 4-0 PC3 18 (SUTURE) ×2 IMPLANT
SUT VIC AB 3-0 SH 27 (SUTURE) ×2
SUT VIC AB 3-0 SH 27X BRD (SUTURE) ×1 IMPLANT
SYR BULB 3OZ (MISCELLANEOUS) ×2 IMPLANT
SYR CONTROL 10ML LL (SYRINGE) ×2 IMPLANT
TOWEL OR 17X24 6PK STRL BLUE (TOWEL DISPOSABLE) ×2 IMPLANT
TOWEL OR 17X26 10 PK STRL BLUE (TOWEL DISPOSABLE) ×2 IMPLANT
TUBE CONNECTING 12X1/4 (SUCTIONS) ×2 IMPLANT
WATER STERILE IRR 1000ML POUR (IV SOLUTION) IMPLANT
YANKAUER SUCT BULB TIP NO VENT (SUCTIONS) ×2 IMPLANT

## 2015-08-04 NOTE — Transfer of Care (Signed)
Immediate Anesthesia Transfer of Care Note  Patient: Kelli Mclean  Procedure(s) Performed: Procedure(s): RIGHT NIPPLE DUCT EXCISION (Right)  Patient Location: PACU  Anesthesia Type:General  Level of Consciousness: awake, alert  and oriented  Airway & Oxygen Therapy: Patient Spontanous Breathing and Patient connected to nasal cannula oxygen  Post-op Assessment: Report given to RN, Post -op Vital signs reviewed and stable and Patient moving all extremities X 4  Post vital signs: Reviewed and stable  Last Vitals:  Filed Vitals:   08/04/15 0655  BP: 149/89  Pulse: 62  Temp: 37.2 C  Resp: 20    Complications: No apparent anesthesia complications

## 2015-08-04 NOTE — H&P (Signed)
History of Present Illness  Patient words: breast f/u.  The patient is a 59 year old female who presents with a complaint of nipple discharge. Referred by Dr. Reginia Forts for evaluation of persistent right nipple discharge.  This is a 59 yo female who presents with several months of persistent clear right nipple discharge. A few months ago, she began noticing some bloody, darker drainage. She underwent a diagnostic mammogram and ultrasound 12/08/14 after a routine screening mammogram in 11/15. These were all negative. She underwent two attempts at a ductogram on 12/08/14 and 12/31/14. 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 She is now referred for surgical evaluation. She was originally seen in February and right nipple duct excision was recommended. The patient failed to schedule the surgery because of other life events that occurred during the last few months.  The patient has a history of uterine cancer s/p TAH/BSO. No adjuvant therapy needed.   CLINICAL DATA: Screening.  EXAM: DIGITAL SCREENING BILATERAL MAMMOGRAM WITH 3D TOMO WITH CAD  COMPARISON: Previous exam(s).  ACR Breast Density Category c: The breast tissue is heterogeneously dense, which may obscure small masses.  FINDINGS: There are no findings suspicious for malignancy. Images were processed with CAD.  IMPRESSION: No mammographic evidence of malignancy. A result letter of this screening mammogram will be mailed directly to the patient.  RECOMMENDATION: Screening mammogram in one year. (Code:SM-B-01Y)  BI-RADS CATEGORY 1: Negative.   Electronically Signed By: Willadean Carol M.D. On: 10/05/2014 19:31  CLINICAL DATA: 59 year old female who reports a several year history of spontaneous clear nipple discharge which has recently become rusty brown in the past several months.  EXAM: DIGITAL DIAGNOSTIC RIGHT MAMMOGRAM WITH  CAD  ULTRASOUND RIGHT BREAST  COMPARISON: 10/05/2014, additional prior imaging dating back to 02/01/2009  ACR Breast Density Category c: The breast tissue is heterogeneously dense, which may obscure small masses.  FINDINGS: No suspicious mass, calcifications, or other abnormality is identified. The breast parenchymal pattern appears stable dating back to at least 2012.  Mammographic images were processed with CAD.  On physical exam, a small amount of clear nipple discharge is expressed from a single duct in the central aspect of the nipple.  Targeted ultrasound of the right retroareolar region demonstrates mild duct ectasia without definite intraductal mass.  IMPRESSION: No mammographic or sonographic explanation is identified for the patient's nipple discharge.  RECOMMENDATION: Further evaluation with right ductogram is recommended.  I have discussed the findings and recommendations with the patient. Results were also provided in writing at the conclusion of the visit. If applicable, a reminder letter will be sent to the patient regarding the next appointment.  BI-RADS CATEGORY 0: Incomplete. Need additional imaging evaluation and/or prior mammograms for comparison.   Electronically Signed By: Pamelia Hoit M.D. On: 12/08/2014 15:42  ADDENDUM REPORT: 12/31/2014 12:38  ADDENDUM: The patient returned on 12/31/2014 for a repeat attempt at a right ductogram. A small amount of clear, tan-colored fluid was elicited from a central orifice in the right nipple. The discharging orifice was cannulated with a ductogram needle. With injection of contrast, there was immediate reflux of contrast out of the nipple orifice. Subsequent spot magnification mammogram images demonstrated a small amount of contrast in a central retronipple duct, identical to that seen on 12/08/2014. No contrast extended more posteriorly.  IMPRESSION: Obstruction of a central right nipple duct just  behind the nipple. 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  An obstructing malignancy is significantly less likely based on the history of spontaneous discharge for several years, but not excluded.  RECOMMENDATION:  Surgical consultation for right duct excision. This has been discussed with the patient. She was given an appointment with Dr. Georgette Dover on 01/11/2015 at 9:30 a.m.  BI-RADS CATEGORY:  4: Suspicious.   Electronically Signed By: Enrique Sack M.D. On: 12/31/2014 12:38  Allergies  No Known Drug Allergies 01/11/2015  Medication History  ALPRAZolam (0.25MG  Tablet, Oral) Active. Simvastatin (40MG  Tablet, Oral) Active. FLUoxetine HCl (20MG  Tablet, Oral) Active. Aspirin EC (81MG  Tablet DR, Oral) Active. FLUoxetine HCl (20MG  Capsule, Oral) Active. Fish Oil (1000MG  Capsule DR, Oral) Active. Vitamin D (1000UNIT Capsule, Oral) Active. Medications Reconciled    Vitals   Weight: 125 lb Height: 62in Body Surface Area: 1.57 m Body Mass Index: 22.86 kg/m Temp.: 28F(Temporal)  Pulse: 79 (Regular)  BP: 128/80 (Sitting, Left Arm, Standard)     Physical Exam   The physical exam findings are as follows: Note:WDWN in NAD HEENT: EOMI, sclera anicteric Neck: No masses, no thyromegaly Lungs: CTA bilaterally; normal respiratory effort Breasts: symmetric; no palpable masses or lymphadenopathy; clear drainage from center of right nipple CV: Regular rate and rhythm; no murmurs Abd: +bowel sounds, soft, non-tender, no masses Ext: Well-perfused; no edema Skin: Warm, dry; no sign of jaundice    Assessment & Plan   DISCHARGE FROM RIGHT NIPPLE (611.79  N64.52)  Current Plans Schedule for Surgery - Right nipple duct excision. The surgical procedure has been discussed with the patient. Potential risks, benefits, alternative treatments, and expected outcomes have been explained. All of the patient's questions at this time have been answered.  The likelihood of reaching the patient's treatment goal is good. The patient understand the proposed surgical procedure and wishes to proceed.  Imogene Burn. Georgette Dover, MD, [facility]  General/ Trauma Surgery  08/04/2015 7:30 AM

## 2015-08-04 NOTE — Op Note (Signed)
Pre-op Diagnosis:  Persistent right nipple discharge Post-op diagnosis:  Same Procedure:  Right nipple duct excision Surgeon:  Kaytelynn Scripter K. Anesthesia:  Gen - LMA Indications:  This is a 59 yo female who presents with several months of right nipple discharge. Occasionally this becomes bloody in appearance. She has under gone an extensive workup and a ductogram showed a probable intraductal papilloma just behind the central portion of the right nipple. She presents now for nipple duct excision.  Description of procedure: The patient brought to the operating room placed in supine position operative table. After an adequate level of general anesthesia was obtained her right breast was prepped with ChloraPrep and draped sterile fashion. Timeout was taken to ensure the proper patient and proper procedure. The right breast was examined and were able to express a small amount of nipple discharge from the central portion of the nipple. This nipple duct opening was cannulated with a lacrimal duct probe. We made a curvilinear incision around the edge of the areole on the right. We dissected behind the nipple until we encountered the nipple duct with the probe. This was excised off of the back of the nipple. While excising this a small counter incision was made in the edge of the nipple. We excised the nipple duct and a cone of breast tissue directly behind the nipple. This specimen was oriented with a paint kit and sent for pathologic examination. We took a small amount of extra anterior margin right off of the posterior surface of the dermis of the nipple and sent this separately. The small counter incision was closed with 5-0 Monocryl. The main incision was inspected for hemostasis. We closed with a deep layer of 3-0 Vicryl and a subcuticular layer of 4-0 Monocryl. Steri-Strips and clean dressing were applied. The patient was then extubated but become stable condition. All sponge, initially, and needle counts are  correct.  Imogene Burn. Georgette Dover, MD, [facility]  General/ Trauma Surgery  08/04/2015 9:44 AM

## 2015-08-04 NOTE — Progress Notes (Addendum)
Pt reports she is still taking Amoxicillin started taking on Friday. C/o throat tightness, but no chest tightness. Reports no fever that she is aware of. Reports productive cough with sputum at times, but sputum is now clear.

## 2015-08-04 NOTE — Anesthesia Preprocedure Evaluation (Addendum)
Anesthesia Evaluation  Patient identified by MRN, date of birth, ID band Patient awake    Reviewed: Allergy & Precautions, NPO status , Patient's Chart, lab work & pertinent test results  Airway Mallampati: II  TM Distance: >3 FB Neck ROM: Full    Dental  (+) Dental Advisory Given, Teeth Intact   Pulmonary    Pulmonary exam normal       Cardiovascular Normal cardiovascular exam    Neuro/Psych PSYCHIATRIC DISORDERS Anxiety    GI/Hepatic   Endo/Other    Renal/GU      Musculoskeletal   Abdominal   Peds  Hematology   Anesthesia Other Findings   Reproductive/Obstetrics                           Anesthesia Physical Anesthesia Plan  ASA: II  Anesthesia Plan: General   Post-op Pain Management:    Induction: Intravenous  Airway Management Planned: LMA  Additional Equipment:   Intra-op Plan:   Post-operative Plan: Extubation in OR  Informed Consent: I have reviewed the patients History and Physical, chart, labs and discussed the procedure including the risks, benefits and alternatives for the proposed anesthesia with the patient or authorized representative who has indicated his/her understanding and acceptance.   Dental advisory given  Plan Discussed with: CRNA and Surgeon  Anesthesia Plan Comments:        Anesthesia Quick Evaluation

## 2015-08-04 NOTE — Discharge Instructions (Signed)
[facility] °Office Phone Number [phone] ° °BREAST BIOPSY/ PARTIAL MASTECTOMY: POST OP INSTRUCTIONS ° °Always review your discharge instruction sheet given to you by the facility where your surgery was performed. ° °IF YOU HAVE DISABILITY OR FAMILY LEAVE FORMS, YOU MUST BRING THEM TO THE OFFICE FOR PROCESSING.  DO NOT GIVE THEM TO YOUR DOCTOR. ° °1. A prescription for pain medication may be given to you upon discharge.  Take your pain medication as prescribed, if needed.  If narcotic pain medicine is not needed, then you may take acetaminophen (Tylenol) or ibuprofen (Advil) as needed. °2. Take your usually prescribed medications unless otherwise directed °3. If you need a refill on your pain medication, please contact your pharmacy.  They will contact our office to request authorization.  Prescriptions will not be filled after 5pm or on week-ends. °4. You should eat very light the first 24 hours after surgery, such as soup, crackers, pudding, etc.  Resume your normal diet the day after surgery. °5. Most patients will experience some swelling and bruising in the breast.  Ice packs and a good support bra will help.  Swelling and bruising can take several days to resolve.  °6. It is common to experience some constipation if taking pain medication after surgery.  Increasing fluid intake and taking a stool softener will usually help or prevent this problem from occurring.  A mild laxative (Milk of Magnesia or Miralax) should be taken according to package directions if there are no bowel movements after 48 hours. °7. Unless discharge instructions indicate otherwise, you may remove your bandages 48 hours after surgery, and you may shower at that time.  You will have steri-strips (small skin tapes) in place directly over the incision.  These strips should be left on the skin for 7-10 days.   Any sutures or staples will be removed at the office during your follow-up visit. °8. ACTIVITIES:  You may resume  regular daily activities (gradually increasing) beginning the next day.  Wearing a good support bra or sports bra minimizes pain and swelling.  You may have sexual intercourse when it is comfortable. °a. You may drive when you no longer are taking prescription pain medication, you can comfortably wear a seatbelt, and you can safely maneuver your car and apply brakes. °b. RETURN TO WORK:  1-2 weeks °9. You should see your doctor in the office for a follow-up appointment approximately two weeks after your surgery.  Your doctor’s nurse will typically make your follow-up appointment when she calls you with your pathology report.  Expect your pathology report 2-3 business days after your surgery.  You may call to check if you do not hear from us after three days. °10. OTHER INSTRUCTIONS: _______________________________________________________________________________________________ _____________________________________________________________________________________________________________________________________ °_____________________________________________________________________________________________________________________________________ °_____________________________________________________________________________________________________________________________________ ° °WHEN TO CALL YOUR DOCTOR: °1. Fever over 101.0 °2. Nausea and/or vomiting. °3. Extreme swelling or bruising. °4. Continued bleeding from incision. °5. Increased pain, redness, or drainage from the incision. ° °The clinic staff is available to answer your questions during regular business hours.  Please don’t hesitate to call and ask to speak to one of the nurses for clinical concerns.  If you have a medical emergency, go to the nearest emergency room or call 911.  A surgeon from [facility] is always on call at the hospital. ° °For further questions, please visit [email or web address]  ° ° °

## 2015-08-04 NOTE — Anesthesia Procedure Notes (Signed)
Procedure Name: LMA Insertion Date/Time: 08/04/2015 8:36 AM Performed by: Garrison Columbus T Pre-anesthesia Checklist: Patient identified, Emergency Drugs available, Suction available and Patient being monitored Patient Re-evaluated:Patient Re-evaluated prior to inductionOxygen Delivery Method: Circle system utilized Preoxygenation: Pre-oxygenation with 100% oxygen Intubation Type: IV induction Ventilation: Mask ventilation without difficulty LMA: LMA inserted LMA Size: 4.0 Number of attempts: 1 Placement Confirmation: positive ETCO2 and breath sounds checked- equal and bilateral Tube secured with: Tape Dental Injury: Teeth and Oropharynx as per pre-operative assessment

## 2015-08-04 NOTE — Anesthesia Postprocedure Evaluation (Signed)
Anesthesia Post Note  Patient: Kelli Mclean  Procedure(s) Performed: Procedure(s) (LRB): RIGHT NIPPLE DUCT EXCISION (Right)  Anesthesia type: general  Patient location: PACU  Post pain: Pain level controlled  Post assessment: Patient's Cardiovascular Status Stable  Last Vitals:  Filed Vitals:   08/04/15 1045  BP:   Pulse:   Temp: 36.8 C  Resp:     Post vital signs: Reviewed and stable  Level of consciousness: sedated  Complications: No apparent anesthesia complications

## 2015-08-05 ENCOUNTER — Encounter (HOSPITAL_COMMUNITY): Payer: Self-pay | Admitting: Surgery

## 2015-08-16 ENCOUNTER — Ambulatory Visit: Payer: BLUE CROSS/BLUE SHIELD | Admitting: Family Medicine

## 2015-08-16 ENCOUNTER — Telehealth: Payer: Self-pay | Admitting: *Deleted

## 2015-08-16 NOTE — Telephone Encounter (Signed)
Called spoke to patient because she missed her appointment today with Dr Tamala Julian at 11:15 am to follow up on diabetes. Per patient she stated her appointment is on the 08/30/11.

## 2015-08-16 NOTE — Telephone Encounter (Signed)
Called pt about no show today.  Pt never received letter.  Rescheduled for 10/04/15.

## 2015-08-30 ENCOUNTER — Ambulatory Visit: Payer: BLUE CROSS/BLUE SHIELD | Admitting: Family Medicine

## 2015-10-04 ENCOUNTER — Encounter: Payer: Self-pay | Admitting: Family Medicine

## 2015-10-04 ENCOUNTER — Ambulatory Visit (INDEPENDENT_AMBULATORY_CARE_PROVIDER_SITE_OTHER): Payer: BLUE CROSS/BLUE SHIELD | Admitting: Family Medicine

## 2015-10-04 VITALS — BP 119/76 | HR 69 | Temp 99.0°F | Resp 16 | Ht 62.0 in | Wt 124.2 lb

## 2015-10-04 DIAGNOSIS — Z23 Encounter for immunization: Secondary | ICD-10-CM

## 2015-10-04 DIAGNOSIS — Z114 Encounter for screening for human immunodeficiency virus [HIV]: Secondary | ICD-10-CM | POA: Diagnosis not present

## 2015-10-04 DIAGNOSIS — F411 Generalized anxiety disorder: Secondary | ICD-10-CM | POA: Diagnosis not present

## 2015-10-04 DIAGNOSIS — L659 Nonscarring hair loss, unspecified: Secondary | ICD-10-CM

## 2015-10-04 DIAGNOSIS — N6452 Nipple discharge: Secondary | ICD-10-CM | POA: Diagnosis not present

## 2015-10-04 DIAGNOSIS — Z1159 Encounter for screening for other viral diseases: Secondary | ICD-10-CM

## 2015-10-04 DIAGNOSIS — E7800 Pure hypercholesterolemia, unspecified: Secondary | ICD-10-CM | POA: Diagnosis not present

## 2015-10-04 DIAGNOSIS — R3915 Urgency of urination: Secondary | ICD-10-CM | POA: Diagnosis not present

## 2015-10-04 LAB — POCT URINALYSIS DIP (MANUAL ENTRY)
BILIRUBIN UA: NEGATIVE
GLUCOSE UA: NEGATIVE
Ketones, POC UA: NEGATIVE
NITRITE UA: NEGATIVE
PH UA: 8.5
Protein Ur, POC: NEGATIVE
RBC UA: NEGATIVE
Spec Grav, UA: 1.02
UROBILINOGEN UA: 0.2

## 2015-10-04 LAB — LIPID PANEL
CHOL/HDL RATIO: 4.3 ratio (ref <=5.0)
Cholesterol: 280 mg/dL — ABNORMAL HIGH (ref 125–200)
HDL: 65 mg/dL (ref >=46)
LDL Cholesterol: 190 mg/dL — ABNORMAL HIGH (ref <130)
Triglycerides: 126 mg/dL (ref <150)
VLDL: 25 mg/dL (ref <30)

## 2015-10-04 LAB — HEPATITIS C ANTIBODY: HCV AB: NEGATIVE

## 2015-10-04 NOTE — Progress Notes (Signed)
Subjective:    Patient ID: Kelli Mclean, female    DOB: 08-Sep-1956, 59 y.o.   MRN: [id number]  10/04/2015  Follow-up; Alopecia; Breast Problem; and Depression   HPI This 59 y.o. female presents for four month follow-up:   1. Hyperlipidemia: holding Pravastatin for the past four months due to alopecia.  2.  Anxiety:  Patient reports good compliance with medication, good tolerance to medication, and good symptom control.  Went through [employer] and then father passed; had dementia.  Market is done now.  Now can rest.  Does rentals.  Must pick up rentals.    3.  Nipple discharge:  S/p excision ductal; negative pathology; one month follow-up nipple stimulation was fine.  Now for the past week, no stimulation.  Nipple doesn't respond or anything.  Really sore and tight along the area.  No worsening soreness or tightness. Only slight soreness to touch.    4. Alopecia: still thinning; s/p dermatology evaluation; recommended Rogaine.  Thinning has slowed down.  Dermatologist did not feel that statin was the cause.    5. Urinary urgency: onset one week ago.     Review of Systems  Constitutional: Negative for fever, chills, diaphoresis and fatigue.  Eyes: Negative for visual disturbance.  Respiratory: Negative for cough and shortness of breath.   Cardiovascular: Negative for chest pain, palpitations and leg swelling.  Gastrointestinal: Negative for nausea, vomiting, abdominal pain, diarrhea and constipation.  Endocrine: Negative for cold intolerance, heat intolerance, polydipsia, polyphagia and polyuria.  Neurological: Negative for dizziness, tremors, seizures, syncope, facial asymmetry, speech difficulty, weakness, light-headedness, numbness and headaches.    Past Medical History  Diagnosis Date  . Hyperlipidemia   . Anxiety   . Uterine cancer ([place]) 12/04/2010    s/p hysterectomy;  [place].  . Cough     COLD WITH SPUTUM, FEVER 1 DAY   Past Surgical History    Procedure Laterality Date  . Abdominal hysterectomy  12/04/2010    uterine cancer; B oophorectomy.  . Foot surgery      RT  BONE SPUR  . Breast ductal system excision Right 08/04/2015    Procedure: RIGHT NIPPLE DUCT EXCISION;  Surgeon: Donnie Mesa, MD;  Location: [facility];  Service: General;  Laterality: Right;   No Known Allergies Current Outpatient Prescriptions  Medication Sig Dispense Refill  . aspirin 81 MG tablet Take 81 mg by mouth daily.    Marland Kitchen CINNAMON PO Take by mouth.    Marland Kitchen FLUoxetine (PROZAC) 20 MG tablet Take 1-2 tablets (20-40 mg total) by mouth daily. (Patient taking differently: Take 20 mg by mouth daily. ) 180 tablet 3  . GLUCOSAMINE CHONDROITIN COMPLX PO Take by mouth. Taking 2 daily    . HYDROcodone-acetaminophen (NORCO/VICODIN) 5-325 MG per tablet Take 1 tablet by mouth every 4 (four) hours as needed. 40 tablet 0  . hydrocortisone (ANUSOL-HC) 25 MG suppository Place 1 suppository (25 mg total) rectally 2 (two) times daily. (Patient taking differently: Place 25 mg rectally 2 (two) times daily as needed for hemorrhoids. ) 12 suppository 2  . Multiple Vitamins-Minerals (MULTIVITAMIN WITH MINERALS) tablet Take 1 tablet by mouth daily.    . cholecalciferol (VITAMIN D) 1000 UNITS tablet Take 1,000 Units by mouth daily.     Current Facility-Administered Medications  Medication Dose Route Frequency Provider Last Rate Last Dose  . aspirin tablet 325 mg  325 mg Oral Daily Orma Flaming, MD       Social History   Social  History  . Marital Status: Married    Spouse Name: N/A  . Number of Children: N/A  . Years of Education: N/A   Occupational History  . FLORIST    Social History Main Topics  . Smoking status: Never Smoker   . Smokeless tobacco: Never Used  . Alcohol Use: No  . Drug Use: No  . Sexual Activity: Not on file   Other Topics Concern  . Not on file   Social History Narrative   Marital status:  Married x 30 years; happily married.      Children: 3 children;  no grandchildren.      Lives: with husband.      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   Family History  Problem Relation Age of Onset  . Heart disease Mother   . Hyperlipidemia Mother   . Heart failure Father   . Dementia Father   . Heart disease Paternal Grandfather   . Diabetes Brother   . Hyperlipidemia Brother   . Heart disease Maternal Grandfather   . Heart disease Brother   . Hyperlipidemia Brother   . Colon cancer Neg Hx        Objective:    BP 119/76 mmHg  Pulse 69  Temp(Src) 99 F (37.2 C) (Oral)  Resp 16  Ht 5\' 2"  (1.575 m)  Wt 124 lb 3.2 oz (56.337 kg)  BMI 22.71 kg/m2 Physical Exam  Constitutional: She is oriented to person, place, and time. She appears well-developed and well-nourished. No distress.  HENT:  Head: Normocephalic and atraumatic.  Right Ear: External ear normal.  Left Ear: External ear normal.  Nose: Nose normal.  Mouth/Throat: Oropharynx is clear and moist.  Eyes: Conjunctivae and EOM are normal. Pupils are equal, round, and reactive to light.  Neck: Normal range of motion. Neck supple. Carotid bruit is not present. No thyromegaly present.  Cardiovascular: Normal rate, regular rhythm, normal heart sounds and intact distal pulses.  Exam reveals no gallop and no friction rub.   No murmur heard. Pulmonary/Chest: Effort normal and breath sounds normal. She has no wheezes. She has no rales.  Abdominal: Soft. Bowel sounds are normal. She exhibits no distension and no mass. There is no tenderness. There is no rebound and no guarding.  Lymphadenopathy:    She has no cervical adenopathy.  Neurological: She is alert and oriented to person, place, and time. No cranial nerve deficit.  Skin: Skin is warm and dry. No rash noted. She is not diaphoretic. No erythema. No pallor.  Psychiatric: She has a normal mood and affect. Her behavior is normal.   Results for orders placed or  performed in visit on 10/04/15  POCT urinalysis dipstick  Result Value Ref Range   Color, UA light yellow (A) yellow   Clarity, UA cloudy (A) clear   Glucose, UA negative negative   Bilirubin, UA negative negative   Ketones, POC UA negative negative   Spec Grav, UA 1.020    Blood, UA negative negative   pH, UA 8.5    Protein Ur, POC negative negative   Urobilinogen, UA 0.2    Nitrite, UA Negative Negative   Leukocytes, UA small (1+) (A) Negative   INFLUENZA VACCINE ADMINISTERED.    Assessment & Plan:   1. Pure hypercholesterolemia   2. Urinary urgency   3. Need for hepatitis C  screening test   4. Screening for HIV (human immunodeficiency virus)   5. Need for prophylactic vaccination and inoculation against influenza   6. Generalized anxiety disorder   7. Nipple discharge   8. Alopecia    -Obtain labs fasting to see if statin still warranted. -contact general surgeon regarding decrease in nipple on R where ductal excision performed; no evidence of cellulitis. -send urine culture. -emotionally stable despite death of father this weekend; coping well; no changes to therapy.   Orders Placed This Encounter  Procedures  . Urine culture  . Flu Vaccine QUAD 36+ mos IM  . Lipid panel    Order Specific Question:  Has the patient fasted?    Answer:  Yes  . Hepatitis C antibody  . HIV antibody  . POCT urinalysis dipstick   No orders of the defined types were placed in this encounter.    Return in about 6 months (around 04/02/2016) for complete physical examiniation.    Kandee Escalante Elayne Guerin, M.D. [facility] [address] [place], [place]  [address] [phone] phone [phone] fax

## 2015-10-04 NOTE — Patient Instructions (Signed)
1. Call your general surgeon regarding symptoms.

## 2015-10-05 LAB — HIV ANTIBODY (ROUTINE TESTING W REFLEX): HIV: NONREACTIVE

## 2015-10-06 LAB — URINE CULTURE: Colony Count: 5000

## 2016-04-03 ENCOUNTER — Encounter: Payer: BLUE CROSS/BLUE SHIELD | Admitting: Family Medicine

## 2016-04-13 ENCOUNTER — Ambulatory Visit (INDEPENDENT_AMBULATORY_CARE_PROVIDER_SITE_OTHER): Payer: BLUE CROSS/BLUE SHIELD | Admitting: Family Medicine

## 2016-04-13 ENCOUNTER — Telehealth: Payer: Self-pay

## 2016-04-13 VITALS — BP 122/80 | HR 74 | Temp 98.0°F | Resp 18 | Ht 62.0 in | Wt 129.0 lb

## 2016-04-13 DIAGNOSIS — E7800 Pure hypercholesterolemia, unspecified: Secondary | ICD-10-CM | POA: Diagnosis not present

## 2016-04-13 DIAGNOSIS — R300 Dysuria: Secondary | ICD-10-CM

## 2016-04-13 DIAGNOSIS — F411 Generalized anxiety disorder: Secondary | ICD-10-CM | POA: Diagnosis not present

## 2016-04-13 LAB — CBC WITH DIFFERENTIAL/PLATELET
BASOS ABS: 0 {cells}/uL (ref 0–200)
BASOS PCT: 0 %
EOS PCT: 1 %
Eosinophils Absolute: 85 cells/uL (ref 15–500)
HCT: 43 % (ref 35.0–45.0)
Hemoglobin: 14.6 g/dL (ref 11.7–15.5)
LYMPHS PCT: 18 %
Lymphs Abs: 1530 cells/uL (ref 850–3900)
MCH: 31.2 pg (ref 27.0–33.0)
MCHC: 34 g/dL (ref 32.0–36.0)
MCV: 91.9 fL (ref 80.0–100.0)
MONOS PCT: 7 %
MPV: 11.4 fL (ref 7.5–12.5)
Monocytes Absolute: 595 cells/uL (ref 200–950)
NEUTROS ABS: 6290 {cells}/uL (ref 1500–7800)
Neutrophils Relative %: 74 %
PLATELETS: 222 10*3/uL (ref 140–400)
RBC: 4.68 MIL/uL (ref 3.80–5.10)
RDW: 13.5 % (ref 11.0–15.0)
WBC: 8.5 10*3/uL (ref 3.8–10.8)

## 2016-04-13 LAB — COMPREHENSIVE METABOLIC PANEL
ALT: 14 U/L (ref 6–29)
AST: 18 U/L (ref 10–35)
Albumin: 4.4 g/dL (ref 3.6–5.1)
Alkaline Phosphatase: 95 U/L (ref 33–130)
BUN: 16 mg/dL (ref 7–25)
CHLORIDE: 102 mmol/L (ref 98–110)
CO2: 26 mmol/L (ref 20–31)
CREATININE: 0.8 mg/dL (ref 0.50–0.99)
Calcium: 9.3 mg/dL (ref 8.6–10.4)
GLUCOSE: 86 mg/dL (ref 65–99)
Potassium: 4.3 mmol/L (ref 3.5–5.3)
SODIUM: 142 mmol/L (ref 135–146)
TOTAL PROTEIN: 6.6 g/dL (ref 6.1–8.1)
Total Bilirubin: 0.6 mg/dL (ref 0.2–1.2)

## 2016-04-13 LAB — LIPID PANEL
CHOL/HDL RATIO: 3 ratio (ref <=5.0)
CHOLESTEROL: 192 mg/dL (ref 125–200)
HDL: 64 mg/dL (ref >=46)
LDL CALC: 113 mg/dL (ref <130)
Triglycerides: 77 mg/dL (ref <150)
VLDL: 15 mg/dL (ref <30)

## 2016-04-13 LAB — POC MICROSCOPIC URINALYSIS (UMFC): Mucus: ABSENT

## 2016-04-13 LAB — POCT URINALYSIS DIP (MANUAL ENTRY)
Bilirubin, UA: NEGATIVE
Glucose, UA: NEGATIVE
Ketones, POC UA: NEGATIVE
NITRITE UA: NEGATIVE
PH UA: 6
Spec Grav, UA: <=1.005
Urobilinogen, UA: 0.2

## 2016-04-13 MED ORDER — PHENAZOPYRIDINE HCL 100 MG PO TABS: 100.0000 mg | ORAL_TABLET | Freq: Three times a day (TID) | ORAL | Status: DC | PRN

## 2016-04-13 MED ORDER — FLUOXETINE HCL 20 MG PO TABS: 20.0000 mg | ORAL_TABLET | Freq: Every day | ORAL | Status: DC

## 2016-04-13 MED ORDER — PRAVASTATIN SODIUM 40 MG PO TABS: 40.0000 mg | ORAL_TABLET | Freq: Every day | ORAL | Status: DC

## 2016-04-13 MED ORDER — CIPROFLOXACIN HCL 500 MG PO TABS: 500.0000 mg | ORAL_TABLET | Freq: Two times a day (BID) | ORAL | Status: DC

## 2016-04-13 NOTE — Progress Notes (Signed)
Subjective:    Patient ID: Kelli Mclean, female    DOB: 1956-08-10, 60 y.o.   MRN: [id number]  04/13/2016  burning when urinate   HPI This 60 y.o. female presents for evaluation of dysuria.  No fever; +chills/sweats.  +abdominal pain onset four days ago.  No frequency. +bloated.  +hematuria.  +urgency only.  No nocturia.  +nausea; no vomiting.  No vomiting.  No diarrhea;+constipated some; took mineral oil three days ago.  Passing plenty of gas.  No back pain.  Appetite down.  Rare UTI.  GEts right after furniture market.    Hypercholesterolemia: Patient reports good compliance with medication, good tolerance to medication, and good symptom control.    Anxiety and depression: Patient reports good compliance with medication, good tolerance to medication, and good symptom control.  Stable at this time.  Review of Systems  Constitutional: Positive for chills and diaphoresis. Negative for fever and fatigue.  Eyes: Negative for visual disturbance.  Respiratory: Negative for cough and shortness of breath.   Cardiovascular: Negative for chest pain, palpitations and leg swelling.  Gastrointestinal: Positive for nausea, constipation and abdominal distention. Negative for vomiting, abdominal pain and diarrhea.  Endocrine: Negative for cold intolerance, heat intolerance, polydipsia, polyphagia and polyuria.  Genitourinary: Positive for dysuria, urgency, hematuria and pelvic pain.  Neurological: Negative for dizziness, tremors, seizures, syncope, facial asymmetry, speech difficulty, weakness, light-headedness, numbness and headaches.    Past Medical History  Diagnosis Date  . Hyperlipidemia   . Anxiety   . Uterine cancer ([place]) 12/04/2010    s/p hysterectomy;  [place].  . Cough     COLD WITH SPUTUM, FEVER 1 DAY   Past Surgical History  Procedure Laterality Date  . Abdominal hysterectomy  12/04/2010    uterine cancer; B oophorectomy.  . Foot surgery      RT  BONE SPUR  . Breast  ductal system excision Right 08/04/2015    Procedure: RIGHT NIPPLE DUCT EXCISION;  Surgeon: Donnie Mesa, MD;  Location: [facility];  Service: General;  Laterality: Right;   No Known Allergies  Social History   Social History  . Marital Status: Married    Spouse Name: N/A  . Number of Children: N/A  . Years of Education: N/A   Occupational History  . FLORIST    Social History Main Topics  . Smoking status: Never Smoker   . Smokeless tobacco: Never Used  . Alcohol Use: No  . Drug Use: No  . Sexual Activity: Not on file   Other Topics Concern  . Not on file   Social History Narrative   Marital status:  Married x 30 years; happily married.      Children: 3 children; no grandchildren.      Lives: with husband.      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   Family History  Problem Relation Age of Onset  . Heart disease Mother   . Hyperlipidemia Mother   . Heart failure Father   . Dementia Father   . Heart disease Paternal Grandfather   . Diabetes Brother   . Hyperlipidemia Brother   . Heart disease Maternal Grandfather   . Heart disease Brother   . Hyperlipidemia Brother   . Colon cancer Neg Hx        Objective:    BP 122/80 mmHg  Pulse  74  Temp(Src) 98 F (36.7 C) (Oral)  Resp 18  Ht 5\' 2"  (1.575 m)  Wt 129 lb (58.514 kg)  BMI 23.59 kg/m2  SpO2 96% Physical Exam  Constitutional: She is oriented to person, place, and time. She appears well-developed and well-nourished. No distress.  HENT:  Head: Normocephalic and atraumatic.  Right Ear: External ear normal.  Left Ear: External ear normal.  Nose: Nose normal.  Mouth/Throat: Oropharynx is clear and moist.  Eyes: Conjunctivae and EOM are normal. Pupils are equal, round, and reactive to light.  Neck: Normal range of motion. Neck supple. Carotid bruit is not present. No thyromegaly present.  Cardiovascular: Normal rate, regular rhythm,  normal heart sounds and intact distal pulses.  Exam reveals no gallop and no friction rub.   No murmur heard. Pulmonary/Chest: Effort normal and breath sounds normal. She has no wheezes. She has no rales.  Abdominal: Soft. Bowel sounds are normal. She exhibits no distension and no mass. There is no tenderness. There is no rebound, no guarding and no CVA tenderness.  Lymphadenopathy:    She has no cervical adenopathy.  Neurological: She is alert and oriented to person, place, and time. No cranial nerve deficit.  Skin: Skin is warm and dry. No rash noted. She is not diaphoretic. No erythema. No pallor.  Psychiatric: She has a normal mood and affect. Her behavior is normal.        Assessment & Plan:   1. Burning with urination   2. Generalized anxiety disorder   3. Pure hypercholesterolemia    -New dysuria; send urine culture; treat with Cipro due to severity of symptoms. -obtain labs; refill provided.  Clinically stable.   Orders Placed This Encounter  Procedures  . Urine culture  . CBC with Differential/Platelet  . Comprehensive metabolic panel    Order Specific Question:  Has the patient fasted?    Answer:  Yes  . Lipid panel    Order Specific Question:  Has the patient fasted?    Answer:  Yes  . POCT Microscopic Urinalysis ([facility])  . POCT urinalysis dipstick   Meds ordered this encounter  Medications  . DISCONTD: pravastatin (PRAVACHOL) 40 MG tablet    Sig: Take 40 mg by mouth daily.  . ciprofloxacin (CIPRO) 500 MG tablet    Sig: Take 1 tablet (500 mg total) by mouth 2 (two) times daily.    Dispense:  20 tablet    Refill:  0  . phenazopyridine (PYRIDIUM) 100 MG tablet    Sig: Take 1 tablet (100 mg total) by mouth 3 (three) times daily as needed for pain.    Dispense:  10 tablet    Refill:  0  . FLUoxetine (PROZAC) 20 MG tablet    Sig: Take 1 tablet (20 mg total) by mouth daily.    Dispense:  90 tablet    Refill:  3  . pravastatin (PRAVACHOL) 40 MG tablet    Sig:  Take 1 tablet (40 mg total) by mouth daily.    Dispense:  90 tablet    Refill:  3    Return in about 6 months (around 10/14/2016) for complete physical examiniation.    Terelle Dobler Elayne Guerin, M.D. [facility] [address] [place], [place]  [address] [phone] phone [phone] fax

## 2016-04-13 NOTE — Telephone Encounter (Signed)
Pt is needing to get a rx for a uti with out coming in   Best number [phone]

## 2016-04-13 NOTE — Patient Instructions (Addendum)
   IF you received an x-ray today, you will receive an invoice from [facility]. Please contact [facility] at [phone] with questions or concerns regarding your invoice.   IF you received labwork today, you will receive an invoice from [facility]. Please contact [facility] at [phone] with questions or concerns regarding your invoice.   Our billing staff will not be able to assist you with questions regarding bills from these companies.  You will be contacted with the lab results as soon as they are available. The fastest way to get your results is to activate your My Chart account. Instructions are located on the last page of this paperwork. If you have not heard from us regarding the results in 2 weeks, please contact this office.     Urinary Tract Infection Urinary tract infections (UTIs) can develop anywhere along your urinary tract. Your urinary tract is your body's drainage system for removing wastes and extra water. Your urinary tract includes two kidneys, two ureters, a bladder, and a urethra. Your kidneys are a pair of bean-shaped organs. Each kidney is about the size of your fist. They are located below your ribs, one on each side of your spine. CAUSES Infections are caused by microbes, which are microscopic organisms, including fungi, viruses, and bacteria. These organisms are so small that they can only be seen through a microscope. Bacteria are the microbes that most commonly cause UTIs. SYMPTOMS  Symptoms of UTIs may vary by age and gender of the patient and by the location of the infection. Symptoms in young women typically include a frequent and intense urge to urinate and a painful, burning feeling in the bladder or urethra during urination. Older women and men are more likely to be tired, shaky, and weak and have muscle aches and abdominal pain. A fever may mean the infection is in your kidneys. Other symptoms of a kidney  infection include pain in your back or sides below the ribs, nausea, and vomiting. DIAGNOSIS To diagnose a UTI, your caregiver will ask you about your symptoms. Your caregiver will also ask you to provide a urine sample. The urine sample will be tested for bacteria and white blood cells. White blood cells are made by your body to help fight infection. TREATMENT  Typically, UTIs can be treated with medication. Because most UTIs are caused by a bacterial infection, they usually can be treated with the use of antibiotics. The choice of antibiotic and length of treatment depend on your symptoms and the type of bacteria causing your infection. HOME CARE INSTRUCTIONS  If you were prescribed antibiotics, take them exactly as your caregiver instructs you. Finish the medication even if you feel better after you have only taken some of the medication.  Drink enough water and fluids to keep your urine clear or pale yellow.  Avoid caffeine, tea, and carbonated beverages. They tend to irritate your bladder.  Empty your bladder often. Avoid holding urine for long periods of time.  Empty your bladder before and after sexual intercourse.  After a bowel movement, women should cleanse from front to back. Use each tissue only once. SEEK MEDICAL CARE IF:   You have back pain.  You develop a fever.  Your symptoms do not begin to resolve within 3 days. SEEK IMMEDIATE MEDICAL CARE IF:   You have severe back pain or lower abdominal pain.  You develop chills.  You have nausea or vomiting.  You have continued burning or discomfort with urination.   MAKE SURE YOU:   Understand these instructions.  Will watch your condition.  Will get help right away if you are not doing well or get worse.   This information is not intended to replace advice given to you by your health care provider. Make sure you discuss any questions you have with your health care provider.   Document Released: 08/30/2005 Document  Revised: 08/11/2015 Document Reviewed: 12/29/2011 Elsevier Interactive Patient Education 2016 Elsevier Inc.  

## 2016-04-13 NOTE — Telephone Encounter (Signed)
Advised pt to return to clinic.

## 2016-04-15 LAB — URINE CULTURE: Colony Count: 50000

## 2016-04-25 ENCOUNTER — Encounter: Payer: BLUE CROSS/BLUE SHIELD | Admitting: Family Medicine

## 2016-05-07 ENCOUNTER — Encounter: Payer: Self-pay | Admitting: Family Medicine

## 2016-06-22 ENCOUNTER — Other Ambulatory Visit: Payer: Self-pay

## 2016-06-22 DIAGNOSIS — F411 Generalized anxiety disorder: Secondary | ICD-10-CM

## 2016-06-22 MED ORDER — PRAVASTATIN SODIUM 40 MG PO TABS: 40.0000 mg | ORAL_TABLET | Freq: Every day | ORAL | Status: DC

## 2016-06-22 MED ORDER — FLUOXETINE HCL 20 MG PO TABS: 20.0000 mg | ORAL_TABLET | Freq: Every day | ORAL | Status: DC

## 2016-06-22 NOTE — Telephone Encounter (Signed)
fax req for 90 da supply w/[facility] Prevastatub 40mg ,  Fluoxetine 20mg - ordered elect w/2 refills

## 2016-10-02 ENCOUNTER — Ambulatory Visit (INDEPENDENT_AMBULATORY_CARE_PROVIDER_SITE_OTHER): Payer: BLUE CROSS/BLUE SHIELD | Admitting: Family Medicine

## 2016-10-02 VITALS — BP 110/78 | HR 65 | Temp 98.5°F | Resp 17 | Ht 62.0 in | Wt 127.0 lb

## 2016-10-02 DIAGNOSIS — F411 Generalized anxiety disorder: Secondary | ICD-10-CM

## 2016-10-02 DIAGNOSIS — Z1231 Encounter for screening mammogram for malignant neoplasm of breast: Secondary | ICD-10-CM | POA: Diagnosis not present

## 2016-10-02 DIAGNOSIS — Z8542 Personal history of malignant neoplasm of other parts of uterus: Secondary | ICD-10-CM | POA: Diagnosis not present

## 2016-10-02 DIAGNOSIS — E7800 Pure hypercholesterolemia, unspecified: Secondary | ICD-10-CM

## 2016-10-02 DIAGNOSIS — Z0000 Encounter for general adult medical examination without abnormal findings: Secondary | ICD-10-CM

## 2016-10-02 DIAGNOSIS — Z23 Encounter for immunization: Secondary | ICD-10-CM

## 2016-10-02 DIAGNOSIS — Z86010 Personal history of colonic polyps: Secondary | ICD-10-CM

## 2016-10-02 DIAGNOSIS — Z131 Encounter for screening for diabetes mellitus: Secondary | ICD-10-CM

## 2016-10-02 DIAGNOSIS — Z8249 Family history of ischemic heart disease and other diseases of the circulatory system: Secondary | ICD-10-CM

## 2016-10-02 LAB — POCT URINALYSIS DIP (MANUAL ENTRY)
BILIRUBIN UA: NEGATIVE
Bilirubin, UA: NEGATIVE
GLUCOSE UA: NEGATIVE
Leukocytes, UA: NEGATIVE
Nitrite, UA: NEGATIVE
Protein Ur, POC: NEGATIVE
SPEC GRAV UA: 1.01
Urobilinogen, UA: 0.2
pH, UA: 5.5

## 2016-10-02 MED ORDER — FLUOXETINE HCL 20 MG PO TABS: 20.0000 mg | ORAL_TABLET | Freq: Every day | ORAL | 3 refills | Status: DC

## 2016-10-02 MED ORDER — PRAVASTATIN SODIUM 40 MG PO TABS: 40.0000 mg | ORAL_TABLET | Freq: Every day | ORAL | 3 refills | Status: DC

## 2016-10-02 NOTE — Patient Instructions (Signed)
     IF you received an x-ray today, you will receive an invoice from [facility]. Please contact [facility] at [phone] with questions or concerns regarding your invoice.   IF you received labwork today, you will receive an invoice from [facility]. Please contact [facility] at [phone] with questions or concerns regarding your invoice.   Our billing staff will not be able to assist you with questions regarding bills from these companies.  You will be contacted with the lab results as soon as they are available. The fastest way to get your results is to activate your My Chart account. Instructions are located on the last page of this paperwork. If you have not heard from us regarding the results in 2 weeks, please contact this office.      

## 2016-10-02 NOTE — Progress Notes (Signed)
Subjective:    Patient ID: Kelli Mclean, female    DOB: 1956/04/25, 60 y.o.   MRN: [id number]  10/02/2016  Annual Exam   HPI This 60 y.o. female presents for Complete Physical Examination.  Last physical: 11-18-14 Pap smear: 11-18-14 WNL; hysterectomy. [place].  Released from care.  Ovaries resected. Mammogram:  12-08-14 Colonoscopy: 01-26-15 Bone density:  [place] in past. Eye exam:  Never. Dental exam:  Every 6 months.   Endometrial cancer: History of IA, grade 1 endometrial cancer. Last visit at [facility] 07/22/2012. Diagnosis 09/25/11 with D&C with hysteroscopy for irregular vaginal bleeding on HRT.  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  S/p total laparoscopic hysterectomy on 11/08/11 with washings.  Pathology negative for residual carcinoma and revealed on EIN.  Recommended follow-up in one year from last visit in 07/22/2012; recommended alternating visits with gynecologist to undergo pelvic exams every six months.  Immunization History  Administered Date(s) Administered  . Influenza,inj,Quad PF,36+ Mos 11/18/2014, 10/04/2015, 10/02/2016  . Tdap 11/18/2014    Visual Acuity Screening   Right eye Left eye Both eyes  Without correction: 20/40 20/40 20/40   With correction:        Review of Systems  Constitutional: Negative for activity change, appetite change, chills, diaphoresis, fatigue, fever and unexpected weight change.  HENT: Negative for congestion, dental problem, drooling, ear discharge, ear pain, facial swelling, hearing loss, mouth sores, nosebleeds, postnasal drip, rhinorrhea, sinus pressure, sneezing, sore throat, tinnitus, trouble swallowing and voice change.   Eyes: Negative for photophobia, pain, discharge, redness, itching and visual disturbance.  Respiratory: Negative for apnea, cough, choking, chest tightness, shortness of breath,  wheezing and stridor.   Cardiovascular: Negative for chest pain, palpitations and leg swelling.  Gastrointestinal: Negative for abdominal distention, abdominal pain, anal bleeding, blood in stool, constipation, diarrhea, nausea, rectal pain and vomiting.  Endocrine: Negative for cold intolerance, heat intolerance, polydipsia, polyphagia and polyuria.  Genitourinary: Negative for decreased urine volume, difficulty urinating, dyspareunia, dysuria, enuresis, flank pain, frequency, genital sores, hematuria, menstrual problem, pelvic pain, urgency, vaginal bleeding, vaginal discharge and vaginal pain.       Nocturia x 0.  No urinary incontinence.  Musculoskeletal: Negative for arthralgias, back pain, gait problem, joint swelling, myalgias, neck pain and neck stiffness.  Skin: Negative for color change, pallor, rash and wound.  Allergic/Immunologic: Negative for environmental allergies, food allergies and immunocompromised state.  Neurological: Negative for dizziness, tremors, seizures, syncope, facial asymmetry, speech difficulty, weakness, light-headedness, numbness and headaches.  Hematological: Negative for adenopathy. Does not bruise/bleed easily.  Psychiatric/Behavioral: Negative for agitation, behavioral problems, confusion, decreased concentration, dysphoric mood, hallucinations, self-injury, sleep disturbance and suicidal ideas. The patient is not nervous/anxious and is not hyperactive.        Bedtime 12:00am; wakes up 7:00am    Past Medical History:  Diagnosis Date  . Anxiety   . Cough    COLD WITH SPUTUM, FEVER 1 DAY  . Endometrial cancer, grade I ([place]) 12/04/2010   s/p hysterectomy/BSO/washings;  [place] and [place].  . Hyperlipidemia    Past Surgical History:  Procedure Laterality Date  . ABDOMINAL HYSTERECTOMY  12/04/2010   uterine cancer; B oophorectomy.  Marland Kitchen BREAST DUCTAL SYSTEM EXCISION Right 08/04/2015   Procedure: RIGHT NIPPLE DUCT EXCISION;  Surgeon: Donnie Mesa, MD;  Location: [facility];  Service: General;  Laterality: Right;  . FOOT SURGERY     RT  BONE SPUR  No Known Allergies Current Outpatient Prescriptions  Medication Sig Dispense Refill  . aspirin 81 MG tablet Take 81 mg by mouth daily.    . cholecalciferol (VITAMIN D) 1000 UNITS tablet Take 1,000 Units by mouth daily.    Marland Kitchen CINNAMON PO Take by mouth.    Marland Kitchen FLUoxetine (PROZAC) 20 MG tablet Take 1 tablet (20 mg total) by mouth daily. 90 tablet 3  . GLUCOSAMINE CHONDROITIN COMPLX PO Take by mouth. Taking 2 daily    . Multiple Vitamins-Minerals (MULTIVITAMIN WITH MINERALS) tablet Take 1 tablet by mouth daily.    . pravastatin (PRAVACHOL) 40 MG tablet Take 1 tablet (40 mg total) by mouth daily. 90 tablet 3   No current facility-administered medications for this visit.    Social History   Social History  . Marital status: Married    Spouse name: N/A  . Number of children: N/A  . Years of education: N/A   Occupational History  . FLORIST    Social History Main Topics  . Smoking status: Never Smoker  . Smokeless tobacco: Never Used  . Alcohol use No  . Drug use: No  . Sexual activity: Not on file   Other Topics Concern  . Not on file   Social History Narrative   Marital status:  Married x 32 years; happily married.      Children: 3 children; no grandchildren.      Lives: with husband.      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      Tobacco:  None      Alcohol: none      Exercise: four days per week ;[employer].   Family History  Problem Relation Age of Onset  . Heart disease Mother 62    CABG/CAD/AMI  . Hyperlipidemia Mother   . Heart failure Father   . Dementia Father   . Heart disease Paternal Grandfather   . Diabetes Brother   . Hyperlipidemia Brother   . Heart disease Brother 4    AMI x 2 with stenting  . Heart disease Maternal Grandfather   . Heart disease Brother   . Hyperlipidemia Brother   . Colon cancer Neg Hx        Objective:    BP 110/78  (BP Location: Right Arm, Patient Position: Sitting, Cuff Size: Normal)   Pulse 65   Temp 98.5 F (36.9 C) (Oral)   Resp 17   Ht 5\' 2"  (1.575 m)   Wt 127 lb (57.6 kg)   SpO2 98%   BMI 23.23 kg/m  Physical Exam  Constitutional: She is oriented to person, place, and time. She appears well-developed and well-nourished. No distress.  HENT:  Head: Normocephalic and atraumatic.  Right Ear: External ear normal.  Left Ear: External ear normal.  Nose: Nose normal.  Mouth/Throat: Oropharynx is clear and moist.  Eyes: Conjunctivae and EOM are normal. Pupils are equal, round, and reactive to light.  Neck: Normal range of motion and full passive range of motion without pain. Neck supple. No JVD present. Carotid bruit is not present. No thyromegaly present.  Cardiovascular: Normal rate, regular rhythm and normal heart sounds.  Exam reveals no gallop and no friction rub.   No murmur heard. Pulmonary/Chest: Effort normal and breath sounds normal. She has no wheezes. She has no rales. Right breast exhibits no inverted nipple, no mass, no nipple discharge, no skin change and no tenderness. Left breast exhibits no inverted nipple, no mass,  no nipple discharge, no skin change and no tenderness. Breasts are symmetrical.  Abdominal: Soft. Bowel sounds are normal. She exhibits no distension and no mass. There is no tenderness. There is no rebound and no guarding.  Genitourinary: Vagina normal. There is no rash, tenderness, lesion or injury on the right labia. There is no rash, tenderness, lesion or injury on the left labia.  Musculoskeletal:       Right shoulder: Normal.       Left shoulder: Normal.       Cervical back: Normal.  Lymphadenopathy:    She has no cervical adenopathy.  Neurological: She is alert and oriented to person, place, and time. She has normal reflexes. No cranial nerve deficit. She exhibits normal muscle tone. Coordination normal.  Skin: Skin is warm and dry. No rash noted. She is not  diaphoretic. No erythema. No pallor.  Psychiatric: She has a normal mood and affect. Her behavior is normal. Judgment and thought content normal.  Nursing note and vitals reviewed.  Depression screen [facility] 2/9 10/02/2016 04/13/2016 10/04/2015 05/28/2015 11/18/2014  Decreased Interest 0 0 3 0 0  Down, Depressed, Hopeless 0 0 0 0 0  PHQ - 2 Score 0 0 3 0 0  Altered sleeping - - 0 - -  Tired, decreased energy - - 3 - -  Change in appetite - - 1 - -  Feeling bad or failure about yourself  - - 0 - -  Trouble concentrating - - 0 - -  Moving slowly or fidgety/restless - - 0 - -  Suicidal thoughts - - 0 - -  PHQ-9 Score - - 7 - -   Wt Readings from Last 3 Encounters:  10/02/16 127 lb (57.6 kg)  04/13/16 129 lb (58.5 kg)  10/04/15 124 lb 3.2 oz (56.3 kg)   BP Readings from Last 3 Encounters:  10/02/16 110/78  04/13/16 122/80  10/04/15 119/76        Assessment & Plan:   1. Routine physical examination   2. Pure hypercholesterolemia   3. Generalized anxiety disorder   4. History of colonic polyps   5. Family history of heart attack   6. Need for prophylactic vaccination and inoculation against influenza   7. History of uterine cancer   8. Screening for diabetes mellitus   9. Encounter for screening mammogram for breast cancer     Orders Placed This Encounter  Procedures  . MM SCREENING BREAST TOMO BILATERAL    Standing Status:   Future    Standing Expiration Date:   12/03/2017    Order Specific Question:   Reason for Exam (SYMPTOM  OR DIAGNOSIS REQUIRED)    Answer:   annual screening    Order Specific Question:   Preferred imaging location?    Answer:   [facility]  . Flu Vaccine QUAD 36+ mos IM  . CBC with Differential/Platelet  . Comprehensive metabolic panel    Order Specific Question:   Has the patient fasted?    Answer:   Yes  . Lipid panel    Order Specific Question:   Has the patient fasted?    Answer:   Yes  . TSH  . Vitamin B12  . VITAMIN D 25 Hydroxy  (Vit-D Deficiency, Fractures)  . Hemoglobin A1c  . POCT urinalysis dipstick  . EKG 12-Lead   Meds ordered this encounter  Medications  . FLUoxetine (PROZAC) 20 MG tablet    Sig: Take 1 tablet (20 mg total) by mouth  daily.    Dispense:  90 tablet    Refill:  3  . pravastatin (PRAVACHOL) 40 MG tablet    Sig: Take 1 tablet (40 mg total) by mouth daily.    Dispense:  90 tablet    Refill:  3    Return in about 6 months (around 04/02/2017) for recheck high cholesterol and anxiety.   Chrysten Woulfe Elayne Guerin, M.D. [facility] [address] [place], [place]  [address] [phone] phone [phone] fax

## 2016-10-03 LAB — CBC WITH DIFFERENTIAL/PLATELET
BASOS PCT: 0 %
Basophils Absolute: 0 cells/uL (ref 0–200)
Eosinophils Absolute: 94 cells/uL (ref 15–500)
Eosinophils Relative: 2 %
HEMATOCRIT: 40.4 % (ref 35.0–45.0)
Hemoglobin: 13.5 g/dL (ref 11.7–15.5)
LYMPHS PCT: 31 %
Lymphs Abs: 1457 cells/uL (ref 850–3900)
MCH: 31.1 pg (ref 27.0–33.0)
MCHC: 33.4 g/dL (ref 32.0–36.0)
MCV: 93.1 fL (ref 80.0–100.0)
MONO ABS: 517 {cells}/uL (ref 200–950)
MPV: 11.3 fL (ref 7.5–12.5)
Monocytes Relative: 11 %
Neutro Abs: 2632 cells/uL (ref 1500–7800)
Neutrophils Relative %: 56 %
Platelets: 195 10*3/uL (ref 140–400)
RBC: 4.34 MIL/uL (ref 3.80–5.10)
RDW: 13.1 % (ref 11.0–15.0)
WBC: 4.7 10*3/uL (ref 3.8–10.8)

## 2016-10-03 LAB — COMPREHENSIVE METABOLIC PANEL
ALBUMIN: 4.1 g/dL (ref 3.6–5.1)
ALK PHOS: 75 U/L (ref 33–130)
ALT: 14 U/L (ref 6–29)
AST: 17 U/L (ref 10–35)
BUN: 18 mg/dL (ref 7–25)
CALCIUM: 9 mg/dL (ref 8.6–10.4)
CO2: 23 mmol/L (ref 20–31)
Chloride: 106 mmol/L (ref 98–110)
Creat: 0.78 mg/dL (ref 0.50–0.99)
Glucose, Bld: 99 mg/dL (ref 65–99)
POTASSIUM: 3.9 mmol/L (ref 3.5–5.3)
Sodium: 140 mmol/L (ref 135–146)
TOTAL PROTEIN: 6.1 g/dL (ref 6.1–8.1)
Total Bilirubin: 0.6 mg/dL (ref 0.2–1.2)

## 2016-10-03 LAB — LIPID PANEL
CHOL/HDL RATIO: 3 ratio (ref <=5.0)
CHOLESTEROL: 181 mg/dL (ref 125–200)
HDL: 60 mg/dL (ref >=46)
LDL Cholesterol: 98 mg/dL (ref <130)
Triglycerides: 115 mg/dL (ref <150)
VLDL: 23 mg/dL (ref <30)

## 2016-10-03 LAB — HEMOGLOBIN A1C
Hgb A1c MFr Bld: 5.1 % (ref <5.7)
Mean Plasma Glucose: 100 mg/dL

## 2016-10-03 LAB — VITAMIN B12: VITAMIN B 12: 840 pg/mL (ref 200–1100)

## 2016-10-03 LAB — TSH: TSH: 1.1 mIU/L

## 2016-10-03 LAB — VITAMIN D 25 HYDROXY (VIT D DEFICIENCY, FRACTURES): VIT D 25 HYDROXY: 38 ng/mL (ref 30–100)

## 2016-10-05 LAB — PAP IG W/ RFLX HPV ASCU

## 2016-10-24 ENCOUNTER — Encounter: Payer: Self-pay | Admitting: Family Medicine

## 2016-10-24 DIAGNOSIS — Z8542 Personal history of malignant neoplasm of other parts of uterus: Secondary | ICD-10-CM | POA: Insufficient documentation

## 2017-01-23 ENCOUNTER — Encounter: Payer: Self-pay | Admitting: Family Medicine

## 2017-04-16 DIAGNOSIS — M25531 Pain in right wrist: Secondary | ICD-10-CM | POA: Diagnosis not present

## 2017-04-24 ENCOUNTER — Other Ambulatory Visit: Payer: Self-pay | Admitting: Family Medicine

## 2017-04-24 DIAGNOSIS — Z1231 Encounter for screening mammogram for malignant neoplasm of breast: Secondary | ICD-10-CM

## 2017-12-13 ENCOUNTER — Other Ambulatory Visit: Payer: Self-pay | Admitting: Family Medicine

## 2017-12-13 DIAGNOSIS — F411 Generalized anxiety disorder: Secondary | ICD-10-CM

## 2017-12-31 ENCOUNTER — Telehealth: Payer: Self-pay | Admitting: Family Medicine

## 2017-12-31 NOTE — Telephone Encounter (Signed)
Copied from [place]. Topic: General - Other >> Dec 31, 2017  5:08 PM Cecelia Byars, NT wrote: Reason for CRM  Patient called and needs a refill on fluoxetine 20 mg sent to [facility] on [address], she has an appointment on 01/09/18 at 4 PM

## 2018-01-01 ENCOUNTER — Other Ambulatory Visit: Payer: Self-pay | Admitting: *Deleted

## 2018-01-01 DIAGNOSIS — F411 Generalized anxiety disorder: Secondary | ICD-10-CM

## 2018-01-01 MED ORDER — FLUOXETINE HCL 20 MG PO TABS: 20.0000 mg | ORAL_TABLET | Freq: Every day | ORAL | 0 refills | Status: DC

## 2018-01-09 ENCOUNTER — Ambulatory Visit (INDEPENDENT_AMBULATORY_CARE_PROVIDER_SITE_OTHER): Payer: BLUE CROSS/BLUE SHIELD | Admitting: Family Medicine

## 2018-01-09 ENCOUNTER — Encounter: Payer: Self-pay | Admitting: Family Medicine

## 2018-01-09 ENCOUNTER — Other Ambulatory Visit: Payer: Self-pay

## 2018-01-09 VITALS — BP 130/82 | HR 68 | Temp 98.0°F | Resp 16 | Ht 62.99 in | Wt 127.8 lb

## 2018-01-09 DIAGNOSIS — M791 Myalgia, unspecified site: Secondary | ICD-10-CM

## 2018-01-09 DIAGNOSIS — E7800 Pure hypercholesterolemia, unspecified: Secondary | ICD-10-CM

## 2018-01-09 DIAGNOSIS — Z131 Encounter for screening for diabetes mellitus: Secondary | ICD-10-CM | POA: Diagnosis not present

## 2018-01-09 DIAGNOSIS — L659 Nonscarring hair loss, unspecified: Secondary | ICD-10-CM

## 2018-01-09 DIAGNOSIS — Z23 Encounter for immunization: Secondary | ICD-10-CM | POA: Diagnosis not present

## 2018-01-09 DIAGNOSIS — F411 Generalized anxiety disorder: Secondary | ICD-10-CM | POA: Diagnosis not present

## 2018-01-09 DIAGNOSIS — Z1231 Encounter for screening mammogram for malignant neoplasm of breast: Secondary | ICD-10-CM

## 2018-01-09 DIAGNOSIS — Z1239 Encounter for other screening for malignant neoplasm of breast: Secondary | ICD-10-CM

## 2018-01-09 MED ORDER — FLUOXETINE HCL 20 MG PO TABS: 20.0000 mg | ORAL_TABLET | Freq: Every day | ORAL | 0 refills | Status: DC

## 2018-01-09 MED ORDER — PRAVASTATIN SODIUM 40 MG PO TABS: 40.0000 mg | ORAL_TABLET | Freq: Every day | ORAL | 3 refills | Status: DC

## 2018-01-09 NOTE — Patient Instructions (Addendum)
  HOLD PRAVASTATIN FOR SIX MONTHS.    IF you received an x-ray today, you will receive an invoice from [facility]. Please contact [facility] at [phone] with questions or concerns regarding your invoice.   IF you received labwork today, you will receive an invoice from [facility]. Please contact [facility] at [phone] with questions or concerns regarding your invoice.   Our billing staff will not be able to assist you with questions regarding bills from these companies.  You will be contacted with the lab results as soon as they are available. The fastest way to get your results is to activate your My Chart account. Instructions are located on the last page of this paperwork. If you have not heard from [place] regarding the results in 2 weeks, please contact this office.

## 2018-01-09 NOTE — Progress Notes (Signed)
Subjective:    Patient ID: Kelli Mclean, female    DOB: 1956-07-16, 62 y.o.   MRN: [id number]  01/09/2018  Chronic Conditions; Depression; and Hyperlipidemia    HPI This 62 y.o. female presents for 41-month follow-up evaluation of hypercholesterolemia, anxiety and depression.  Admits to ongoing stressful work demands and personal demands.  Coping well emotionally.  Upcoming busy season due to Valentine's Day. Hair loss. Legs aching: constantly.  Worried due to statin therapy.  STANDS CONSTANTLY AT WORK; ACHING LEGS.  Uterine cancer:  Questions how long should receive annual pap smears.   BP Readings from Last 3 Encounters:  01/09/18 130/82  10/02/16 110/78  04/13/16 122/80   Wt Readings from Last 3 Encounters:  01/09/18 127 lb 12.8 oz (58 kg)  10/02/16 127 lb (57.6 kg)  04/13/16 129 lb (58.5 kg)   Immunization History  Administered Date(s) Administered  . Influenza,inj,Quad PF,6+ Mos 11/18/2014, 10/04/2015, 10/02/2016, 01/09/2018  . Tdap 11/18/2014    Review of Systems  Constitutional: Negative for activity change, appetite change, chills, diaphoresis, fatigue, fever and unexpected weight change.  HENT: Negative for congestion, dental problem, drooling, ear discharge, ear pain, facial swelling, hearing loss, mouth sores, nosebleeds, postnasal drip, rhinorrhea, sinus pressure, sneezing, sore throat, tinnitus, trouble swallowing and voice change.   Eyes: Negative for photophobia, pain, discharge, redness, itching and visual disturbance.  Respiratory: Negative for apnea, cough, choking, chest tightness, shortness of breath, wheezing and stridor.   Cardiovascular: Negative for chest pain, palpitations and leg swelling.  Gastrointestinal: Negative for abdominal distention, abdominal pain, anal bleeding, blood in stool, constipation, diarrhea, nausea, rectal pain and vomiting.  Endocrine: Negative for cold intolerance, heat intolerance, polydipsia, polyphagia and polyuria.    Genitourinary: Negative for decreased urine volume, difficulty urinating, dyspareunia, dysuria, enuresis, flank pain, frequency, genital sores, hematuria, menstrual problem, pelvic pain, urgency, vaginal bleeding, vaginal discharge and vaginal pain.  Musculoskeletal: Positive for myalgias. Negative for arthralgias, back pain, gait problem, joint swelling, neck pain and neck stiffness.  Skin: Negative for color change, pallor, rash and wound.  Allergic/Immunologic: Negative for environmental allergies, food allergies and immunocompromised state.  Neurological: Negative for dizziness, tremors, seizures, syncope, facial asymmetry, speech difficulty, weakness, light-headedness, numbness and headaches.  Hematological: Negative for adenopathy. Does not bruise/bleed easily.  Psychiatric/Behavioral: Negative for agitation, behavioral problems, confusion, decreased concentration, dysphoric mood, hallucinations, self-injury, sleep disturbance and suicidal ideas. The patient is nervous/anxious. The patient is not hyperactive.     Past Medical History:  Diagnosis Date  . Anxiety   . Cough    COLD WITH SPUTUM, FEVER 1 DAY  . Endometrial cancer, grade I ([place]) 12/04/2010   s/p hysterectomy/BSO/washings;  [place] and [place].  . Hyperlipidemia    Past Surgical History:  Procedure Laterality Date  . ABDOMINAL HYSTERECTOMY  12/04/2010   uterine cancer; B oophorectomy.  Marland Kitchen BREAST DUCTAL SYSTEM EXCISION Right 08/04/2015   Procedure: RIGHT NIPPLE DUCT EXCISION;  Surgeon: Donnie Mesa, MD;  Location: [facility];  Service: General;  Laterality: Right;  . FOOT SURGERY     RT  BONE SPUR   No Known Allergies Current Outpatient Medications on File Prior to Visit  Medication Sig Dispense Refill  . aspirin 81 MG tablet Take 81 mg by mouth daily.    . cholecalciferol (VITAMIN D) 1000 UNITS tablet Take 1,000 Units by mouth daily.    Marland Kitchen CINNAMON PO Take by mouth.    Marland Kitchen GLUCOSAMINE CHONDROITIN COMPLX PO Take by mouth. Taking 2  daily    .  Multiple Vitamins-Minerals (MULTIVITAMIN WITH MINERALS) tablet Take 1 tablet by mouth daily.     No current facility-administered medications on file prior to visit.    Social History   Socioeconomic History  . Marital status: Married    Spouse name: Not on file  . Number of children: Not on file  . Years of education: Not on file  . Highest education level: Not on file  Social Needs  . Financial resource strain: Not on file  . Food insecurity - worry: Not on file  . Food insecurity - inability: Not on file  . Transportation needs - medical: Not on file  . Transportation needs - non-medical: Not on file  Occupational History  . Occupation: FLORIST  Tobacco Use  . Smoking status: Never Smoker  . Smokeless tobacco: Never Used  Substance and Sexual Activity  . Alcohol use: No    Alcohol/week: 0.0 oz  . Drug use: No  . Sexual activity: Not on file  Other Topics Concern  . Not on file  Social History Narrative   Marital status:  Married x 32 years; happily married.      Children: 3 children; no grandchildren.      Lives: with husband.      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      Tobacco:  None      Alcohol: none      Exercise: four days per week ;[employer].   Family History  Problem Relation Age of Onset  . Heart disease Mother 37       CABG/CAD/AMI  . Hyperlipidemia Mother   . Heart failure Father   . Dementia Father   . Heart disease Paternal Grandfather   . Diabetes Brother   . Hyperlipidemia Brother   . Heart disease Brother 65       AMI x 2 with stenting  . Heart disease Maternal Grandfather   . Heart disease Brother   . Hyperlipidemia Brother   . Colon cancer Neg Hx        Objective:    BP 130/82   Pulse 68   Temp 98 F (36.7 C) (Oral)   Resp 16   Ht 5' 2.99" (1.6 m)   Wt 127 lb 12.8 oz (58 kg)   SpO2 95%   BMI 22.64 kg/m  Physical Exam  Constitutional: She is oriented to person, place, and time. She  appears well-developed and well-nourished. No distress.  HENT:  Head: Normocephalic and atraumatic.  Right Ear: Hearing, tympanic membrane, external ear and ear canal normal.  Left Ear: Hearing, tympanic membrane, external ear and ear canal normal.  Nose: Nose normal.  Mouth/Throat: Oropharynx is clear and moist.  Eyes: Conjunctivae and EOM are normal. Pupils are equal, round, and reactive to light.  Neck: Normal range of motion and full passive range of motion without pain. Neck supple. No JVD present. Carotid bruit is not present. No thyromegaly present.  Cardiovascular: Normal rate, regular rhythm, normal heart sounds and intact distal pulses. Exam reveals no gallop and no friction rub.  No murmur heard. Pulmonary/Chest: Effort normal and breath sounds normal. No respiratory distress. She has no wheezes. She has no rales. Right breast exhibits no inverted nipple, no mass, no nipple discharge, no skin change and no tenderness. Left breast exhibits no inverted nipple, no mass, no nipple discharge, no skin change and no tenderness. Breasts are symmetrical.  Abdominal: Soft. Bowel sounds are normal. She exhibits no  distension and no mass. There is no tenderness. There is no rebound and no guarding.  Musculoskeletal:       Right shoulder: Normal.       Left shoulder: Normal.       Cervical back: Normal.  Lymphadenopathy:    She has no cervical adenopathy.  Neurological: She is alert and oriented to person, place, and time. She has normal reflexes. No cranial nerve deficit. She exhibits normal muscle tone. Coordination normal.  Skin: Skin is warm and dry. No rash noted. She is not diaphoretic. No erythema. No pallor.  Mild thinning of hair parietal regions B and frontal.   No scaling of scalp.  Psychiatric: She has a normal mood and affect. Her behavior is normal. Judgment and thought content normal.  Nursing note and vitals reviewed.  No results found. Depression screen [facility] 2/9 01/09/2018  10/02/2016 04/13/2016 10/04/2015 05/28/2015  Decreased Interest 0 0 0 3 0  Down, Depressed, Hopeless 0 0 0 0 0  PHQ - 2 Score 0 0 0 3 0  Altered sleeping - - - 0 -  Tired, decreased energy - - - 3 -  Change in appetite - - - 1 -  Feeling bad or failure about yourself  - - - 0 -  Trouble concentrating - - - 0 -  Moving slowly or fidgety/restless - - - 0 -  Suicidal thoughts - - - 0 -  PHQ-9 Score - - - 7 -   Fall Risk  01/09/2018 10/02/2016 04/13/2016 10/04/2015 11/18/2014  Falls in the past year? No No No No Yes  Number falls in past yr: - - - - 1  Injury with Fall? - - - - Yes  Comment - - - - LEFT FOOT        Assessment & Plan:   1. Pure hypercholesterolemia   2. Generalized anxiety disorder   3. Screening for diabetes mellitus   4. Breast cancer screening   5. Need for prophylactic vaccination and inoculation against influenza   6. Myalgia   7. Hair loss     Hypercholesterolemia controlled on statin therapy however patient suffering with myalgias and muscle cramps daily.  Agreeable to 55-month trial off of pravastatin.  Obtain labs for chronic disease management. Generalized anxiety disorder: Well-controlled.  Ongoing work stressors and family stressors.  Coping well at this time.  No changes to management.  Refills of Prozac provided. Myalgias: New onset..  Versus leg cramps.  Hold statin therapy for the next 6 months to see if resolves.  Also will obtain CK level to rule out secondary causes. Hair loss: New onset in parietal and frontal regions.  Mild at this time.  Obtain labs including TSH, iron, vitamin D levels.  Status post dermatology consultation for hair loss in the past 2 years.  Recommended Rogaine if patient not interested in daily maintenance. Refer for mammogram.  Status post flu vaccine. Endometrial cancer history IA, grade I: Warrants annual pelvic exam per current guidelines; CYTOLOGY IS NOT INDICATED.  S/p hysterectomy and B Salpingo-oophorectomy and washings in  11/2011.  Last visit with gyn-onc at [facility] in 08-2012; recommended follow-up visit in one year.  Recurrence tends to occur in the first three years of diagnosis; patient now at five years exactly.     Orders Placed This Encounter  Procedures  . MM SCREENING BREAST TOMO BILATERAL    BCBS/pf 10/05/14 bcg/no needs/no hx of breast ca/no implants/af with pt     Standing Status:  Future    Standing Expiration Date:   03/22/2019    Order Specific Question:   Reason for Exam (SYMPTOM  OR DIAGNOSIS REQUIRED)    Answer:   annual screening    Order Specific Question:   Preferred imaging location?    Answer:   [facility]  . Flu Vaccine QUAD 36+ mos IM  . CBC with Differential/Platelet  . Comprehensive metabolic panel    Order Specific Question:   Has the patient fasted?    Answer:   No  . Hemoglobin A1c  . Lipid panel    Order Specific Question:   Has the patient fasted?    Answer:   No  . TSH  . Iron  . VITAMIN D 25 Hydroxy (Vit-D Deficiency, Fractures)  . CK   Meds ordered this encounter  Medications  . FLUoxetine (PROZAC) 20 MG tablet    Sig: Take 1 tablet (20 mg total) by mouth daily.    Dispense:  90 tablet    Refill:  0  . pravastatin (PRAVACHOL) 40 MG tablet    Sig: Take 1 tablet (40 mg total) by mouth daily.    Dispense:  90 tablet    Refill:  3    Return in about 6 months (around 07/09/2018) for complete physical examiniation.   Nanetta Wiegman Elayne Guerin, M.D. Primary Care at [facility] previously [facility] [address] [place], [place]  [address] [phone] phone [phone] fax

## 2018-01-10 LAB — COMPREHENSIVE METABOLIC PANEL
A/G RATIO: 2.1 (ref 1.2–2.2)
ALBUMIN: 4.5 g/dL (ref 3.6–4.8)
ALK PHOS: 93 IU/L (ref 39–117)
ALT: 15 IU/L (ref 0–32)
AST: 22 IU/L (ref 0–40)
BILIRUBIN TOTAL: 0.7 mg/dL (ref 0.0–1.2)
BUN / CREAT RATIO: 15 (ref 12–28)
BUN: 11 mg/dL (ref 8–27)
CO2: 23 mmol/L (ref 20–29)
CREATININE: 0.74 mg/dL (ref 0.57–1.00)
Calcium: 9.2 mg/dL (ref 8.7–10.3)
Chloride: 106 mmol/L (ref 96–106)
GFR calc Af Amer: 100 mL/min/{1.73_m2} (ref >59)
GFR calc non Af Amer: 87 mL/min/{1.73_m2} (ref >59)
Globulin, Total: 2.1 g/dL (ref 1.5–4.5)
Glucose: 91 mg/dL (ref 65–99)
POTASSIUM: 4.2 mmol/L (ref 3.5–5.2)
SODIUM: 146 mmol/L — AB (ref 134–144)
Total Protein: 6.6 g/dL (ref 6.0–8.5)

## 2018-01-10 LAB — LIPID PANEL
CHOLESTEROL TOTAL: 270 mg/dL — AB (ref 100–199)
Chol/HDL Ratio: 4 ratio (ref 0.0–4.4)
HDL: 68 mg/dL (ref >39)
LDL CALC: 184 mg/dL — AB (ref 0–99)
TRIGLYCERIDES: 88 mg/dL (ref 0–149)
VLDL Cholesterol Cal: 18 mg/dL (ref 5–40)

## 2018-01-10 LAB — CBC WITH DIFFERENTIAL/PLATELET
Basophils Absolute: 0 10*3/uL (ref 0.0–0.2)
Basos: 0 %
EOS (ABSOLUTE): 0.1 10*3/uL (ref 0.0–0.4)
EOS: 2 %
HEMATOCRIT: 41.3 % (ref 34.0–46.6)
HEMOGLOBIN: 13.7 g/dL (ref 11.1–15.9)
Immature Grans (Abs): 0 10*3/uL (ref 0.0–0.1)
Immature Granulocytes: 0 %
Lymphocytes Absolute: 2 10*3/uL (ref 0.7–3.1)
Lymphs: 38 %
MCH: 31.1 pg (ref 26.6–33.0)
MCHC: 33.2 g/dL (ref 31.5–35.7)
MCV: 94 fL (ref 79–97)
MONOCYTES: 7 %
MONOS ABS: 0.4 10*3/uL (ref 0.1–0.9)
NEUTROS ABS: 2.7 10*3/uL (ref 1.4–7.0)
Neutrophils: 53 %
Platelets: 205 10*3/uL (ref 150–379)
RBC: 4.4 x10E6/uL (ref 3.77–5.28)
RDW: 13.7 % (ref 12.3–15.4)
WBC: 5.2 10*3/uL (ref 3.4–10.8)

## 2018-01-10 LAB — VITAMIN D 25 HYDROXY (VIT D DEFICIENCY, FRACTURES): Vit D, 25-Hydroxy: 25.4 ng/mL — ABNORMAL LOW (ref 30.0–100.0)

## 2018-01-10 LAB — IRON: Iron: 85 ug/dL (ref 27–139)

## 2018-01-10 LAB — TSH: TSH: 1 u[IU]/mL (ref 0.450–4.500)

## 2018-01-10 LAB — HEMOGLOBIN A1C
Est. average glucose Bld gHb Est-mCnc: 103 mg/dL
Hgb A1c MFr Bld: 5.2 % (ref 4.8–5.6)

## 2018-01-10 LAB — CK: Total CK: 123 U/L (ref 24–173)

## 2018-01-29 ENCOUNTER — Encounter: Payer: Self-pay | Admitting: Family Medicine

## 2018-02-13 ENCOUNTER — Ambulatory Visit
Admission: RE | Admit: 2018-02-13 | Discharge: 2018-02-13 | Disposition: A | Discharge status: Home / Self Care | Payer: BLUE CROSS/BLUE SHIELD | Source: Ambulatory Visit | Attending: Family Medicine | Admitting: Family Medicine

## 2018-02-13 DIAGNOSIS — Z1239 Encounter for other screening for malignant neoplasm of breast: Secondary | ICD-10-CM

## 2018-02-13 DIAGNOSIS — Z1231 Encounter for screening mammogram for malignant neoplasm of breast: Secondary | ICD-10-CM | POA: Diagnosis not present

## 2018-04-03 ENCOUNTER — Other Ambulatory Visit: Payer: Self-pay | Admitting: Family Medicine

## 2018-04-03 DIAGNOSIS — F411 Generalized anxiety disorder: Secondary | ICD-10-CM

## 2018-04-30 ENCOUNTER — Encounter: Payer: Self-pay | Admitting: Family Medicine

## 2018-05-13 ENCOUNTER — Encounter: Payer: Self-pay | Admitting: Family Medicine

## 2018-05-13 ENCOUNTER — Other Ambulatory Visit: Payer: Self-pay

## 2018-05-13 ENCOUNTER — Ambulatory Visit (INDEPENDENT_AMBULATORY_CARE_PROVIDER_SITE_OTHER): Payer: BLUE CROSS/BLUE SHIELD | Admitting: Family Medicine

## 2018-05-13 VITALS — BP 122/82 | HR 72 | Temp 98.0°F | Resp 16 | Ht 62.4 in | Wt 125.0 lb

## 2018-05-13 DIAGNOSIS — L989 Disorder of the skin and subcutaneous tissue, unspecified: Secondary | ICD-10-CM

## 2018-05-13 DIAGNOSIS — E7800 Pure hypercholesterolemia, unspecified: Secondary | ICD-10-CM

## 2018-05-13 DIAGNOSIS — F411 Generalized anxiety disorder: Secondary | ICD-10-CM

## 2018-05-13 DIAGNOSIS — Z8249 Family history of ischemic heart disease and other diseases of the circulatory system: Secondary | ICD-10-CM | POA: Diagnosis not present

## 2018-05-13 DIAGNOSIS — Z23 Encounter for immunization: Secondary | ICD-10-CM | POA: Diagnosis not present

## 2018-05-13 DIAGNOSIS — Z8542 Personal history of malignant neoplasm of other parts of uterus: Secondary | ICD-10-CM | POA: Diagnosis not present

## 2018-05-13 DIAGNOSIS — L659 Nonscarring hair loss, unspecified: Secondary | ICD-10-CM

## 2018-05-13 DIAGNOSIS — Z0000 Encounter for general adult medical examination without abnormal findings: Secondary | ICD-10-CM

## 2018-05-13 DIAGNOSIS — Z86010 Personal history of colonic polyps: Secondary | ICD-10-CM

## 2018-05-13 LAB — POCT URINALYSIS DIP (MANUAL ENTRY)
Bilirubin, UA: NEGATIVE
Glucose, UA: NEGATIVE mg/dL
Leukocytes, UA: NEGATIVE
NITRITE UA: NEGATIVE
PH UA: 5.5 (ref 5.0–8.0)
Protein Ur, POC: NEGATIVE mg/dL
SPEC GRAV UA: 1.025 (ref 1.010–1.025)
Urobilinogen, UA: 0.2 E.U./dL

## 2018-05-13 MED ORDER — ZOSTER VAC RECOMB ADJUVANTED 50 MCG/0.5ML IM SUSR: 0.5000 mL | Freq: Once | INTRAMUSCULAR | 1 refills | Status: AC

## 2018-05-13 MED ORDER — ZOSTER VAC RECOMB ADJUVANTED 50 MCG/0.5ML IM SUSR: 0.5000 mL | Freq: Once | INTRAMUSCULAR | 1 refills | Status: DC

## 2018-05-13 NOTE — Progress Notes (Signed)
Subjective:    Patient ID: Kelli Mclean, female    DOB: 12/15/1955, 62 y.o.   MRN: [id number]  05/13/2018  Annual Exam    HPI This 62 y.o. female presents for COMPLETE PHYSICAL EXAMINATION.   Visual Acuity Screening   Right eye Left eye Both eyes  Without correction: 20/40 20/40 20/40   With correction:       BP Readings from Last 3 Encounters:  05/13/18 122/82  01/09/18 130/82  10/02/16 110/78   Wt Readings from Last 3 Encounters:  05/13/18 125 lb (56.7 kg)  01/09/18 127 lb 12.8 oz (58 kg)  10/02/16 127 lb (57.6 kg)   Immunization History  Administered Date(s) Administered  . Influenza,inj,Quad PF,6+ Mos 11/18/2014, 10/04/2015, 10/02/2016, 01/09/2018  . Tdap 11/18/2014, 05/13/2018   Health Maintenance  Topic Date Due  . INFLUENZA VACCINE  07/04/2018  . PAP SMEAR  10/03/2019  . COLONOSCOPY  01/27/2020  . MAMMOGRAM  02/14/2020  . TETANUS/TDAP  11/18/2024  . Hepatitis C Screening  Completed  . HIV Screening  Completed   Stopped statin in February 2019.  Legs are not aching.   No change in February. Not taking vitamin D.  Review of Systems  Constitutional: Negative for activity change, appetite change, chills, diaphoresis, fatigue, fever and unexpected weight change.  HENT: Negative for congestion, dental problem, drooling, ear discharge, ear pain, facial swelling, hearing loss, mouth sores, nosebleeds, postnasal drip, rhinorrhea, sinus pressure, sneezing, sore throat, tinnitus, trouble swallowing and voice change.   Eyes: Negative for photophobia, pain, discharge, redness, itching and visual disturbance.  Respiratory: Negative for apnea, cough, choking, chest tightness, shortness of breath, wheezing and stridor.   Cardiovascular: Negative for chest pain, palpitations and leg swelling.  Gastrointestinal: Positive for abdominal pain. Negative for abdominal distention, anal bleeding, blood in stool, constipation, diarrhea, nausea, rectal pain and vomiting.   RLQ pain after eating; occurs once per week on average.  Endocrine: Negative for cold intolerance, heat intolerance, polydipsia, polyphagia and polyuria.  Genitourinary: Negative for decreased urine volume, difficulty urinating, dyspareunia, dysuria, enuresis, flank pain, frequency, genital sores, hematuria, menstrual problem, pelvic pain, urgency, vaginal bleeding, vaginal discharge and vaginal pain.  Musculoskeletal: Negative for arthralgias, back pain, gait problem, joint swelling, myalgias, neck pain and neck stiffness.  Skin: Negative for color change, pallor, rash and wound.  Allergic/Immunologic: Negative for environmental allergies, food allergies and immunocompromised state.  Neurological: Negative for dizziness, tremors, seizures, syncope, facial asymmetry, speech difficulty, weakness, light-headedness, numbness and headaches.  Hematological: Negative for adenopathy. Does not bruise/bleed easily.  Psychiatric/Behavioral: Negative for agitation, behavioral problems, confusion, decreased concentration, dysphoric mood, hallucinations, self-injury, sleep disturbance and suicidal ideas. The patient is not nervous/anxious and is not hyperactive.     Past Medical History:  Diagnosis Date  . Anxiety   . Endometrial cancer, grade I ([place]) 12/04/2010   s/p hysterectomy/BSO/washings;  [place] and [place].  . Hyperlipidemia    Past Surgical History:  Procedure Laterality Date  . ABDOMINAL HYSTERECTOMY  12/04/2010   uterine cancer; B oophorectomy.  Marland Kitchen BREAST DUCTAL SYSTEM EXCISION Right 08/04/2015   Procedure: RIGHT NIPPLE DUCT EXCISION;  Surgeon: Donnie Mesa, MD;  Location: [facility];  Service: General;  Laterality: Right;  . BREAST EXCISIONAL BIOPSY Right 08/04/2015   duct excision  . FOOT SURGERY     RT  BONE SPUR   No Known Allergies Current Outpatient Medications on File Prior to Visit  Medication Sig Dispense Refill  . aspirin 81 MG tablet Take 81 mg by mouth  daily.    . cholecalciferol  (VITAMIN D) 1000 UNITS tablet Take 1,000 Units by mouth daily.    Marland Kitchen CINNAMON PO Take by mouth.    Marland Kitchen FLUoxetine (PROZAC) 20 MG tablet TAKE 1 TABLET(20 MG) BY MOUTH DAILY 90 tablet 2  . GLUCOSAMINE CHONDROITIN COMPLX PO Take by mouth. Taking 2 daily    . Multiple Vitamins-Minerals (MULTIVITAMIN WITH MINERALS) tablet Take 1 tablet by mouth daily.    . Omega-3 1000 MG CAPS Take by mouth.    . pravastatin (PRAVACHOL) 40 MG tablet Take 1 tablet (40 mg total) by mouth daily. 90 tablet 3   No current facility-administered medications on file prior to visit.    Social History   Socioeconomic History  . Marital status: Married    Spouse name: Not on file  . Number of children: 3  . Years of education: Not on file  . Highest education level: Not on file  Occupational History  . Occupation: FLORIST  Social Needs  . Financial resource strain: Not on file  . Food insecurity:    Worry: Not on file    Inability: Not on file  . Transportation needs:    Medical: Not on file    Non-medical: Not on file  Tobacco Use  . Smoking status: Never Smoker  . Smokeless tobacco: Never Used  Substance and Sexual Activity  . Alcohol use: No    Alcohol/week: 0.0 oz  . Drug use: No  . Sexual activity: Yes    Birth control/protection: Post-menopausal, Surgical  Lifestyle  . Physical activity:    Days per week: Not on file    Minutes per session: Not on file  . Stress: Not on file  Relationships  . Social connections:    Talks on phone: Not on file    Gets together: Not on file    Attends religious service: Not on file    Active member of club or organization: Not on file    Attends meetings of clubs or organizations: Not on file    Relationship status: Not on file  . Intimate partner violence:    Fear of current or ex partner: Not on file    Emotionally abused: Not on file    Physically abused: Not on file    Forced sexual activity: Not on file  Other Topics Concern  . Not on file  Social  History Narrative   Marital status:  Married x 35 years; happily married.      Children: 3 children; no grandchildren.      Lives: with husband.        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        Tobacco:  None      Alcohol: none      Exercise: three days per week ;[employer].  [place].         Family History  Problem Relation Age of Onset  . Heart disease Mother 69       CABG/CAD/AMI  . Hyperlipidemia Mother   . Heart failure Father   . Dementia Father   . Heart disease Paternal Grandfather   . Diabetes Brother   . Hyperlipidemia Brother   . Heart disease Brother 24       AMI x 2 with stenting  . Heart disease Maternal Grandfather   . Heart disease Brother   . Hyperlipidemia Brother   . Colon cancer Neg Hx  Objective:    BP 122/82   Pulse 72   Temp 98 F (36.7 C) (Oral)   Resp 16   Ht 5' 2.4" (1.585 m)   Wt 125 lb (56.7 kg)   SpO2 99%   BMI 22.57 kg/m  Physical Exam  Constitutional: She is oriented to person, place, and time. She appears well-developed and well-nourished. No distress.  HENT:  Head: Normocephalic and atraumatic.  Right Ear: External ear normal.  Left Ear: External ear normal.  Nose: Nose normal.  Mouth/Throat: Oropharynx is clear and moist.  Eyes: Pupils are equal, round, and reactive to light. Conjunctivae and EOM are normal.  Neck: Normal range of motion and full passive range of motion without pain. Neck supple. No JVD present. Carotid bruit is not present. No thyromegaly present.  Cardiovascular: Normal rate, regular rhythm and normal heart sounds. Exam reveals no gallop and no friction rub.  No murmur heard. Pulmonary/Chest: Effort normal and breath sounds normal. She has no wheezes. She has no rales. Right breast exhibits no inverted nipple, no mass, no nipple discharge, no skin change and no tenderness. Left breast exhibits no inverted nipple, no mass, no nipple discharge, no skin change and no  tenderness. No breast swelling, tenderness, discharge or bleeding. Breasts are symmetrical.  Abdominal: Soft. Bowel sounds are normal. She exhibits no distension and no mass. There is no tenderness. There is no rebound and no guarding.  Musculoskeletal:       Right shoulder: Normal.       Left shoulder: Normal.       Cervical back: Normal.  Lymphadenopathy:    She has no cervical adenopathy.  Neurological: She is alert and oriented to person, place, and time. She has normal reflexes. No cranial nerve deficit. She exhibits normal muscle tone. Coordination normal.  Skin: Skin is warm and dry. No rash noted. She is not diaphoretic. No erythema. No pallor.     1 cm x 54mm irregular shaped skin lesion with color variation.  Psychiatric: She has a normal mood and affect. Her behavior is normal. Judgment and thought content normal.  Nursing note and vitals reviewed.  No results found. Depression screen [facility] 2/9 05/13/2018 01/09/2018 10/02/2016 04/13/2016 10/04/2015  Decreased Interest 0 0 0 0 3  Down, Depressed, Hopeless 0 0 0 0 0  PHQ - 2 Score 0 0 0 0 3  Altered sleeping - - - - 0  Tired, decreased energy - - - - 3  Change in appetite - - - - 1  Feeling bad or failure about yourself  - - - - 0  Trouble concentrating - - - - 0  Moving slowly or fidgety/restless - - - - 0  Suicidal thoughts - - - - 0  PHQ-9 Score - - - - 7   Fall Risk  05/13/2018 01/09/2018 10/02/2016 04/13/2016 10/04/2015  Falls in the past year? No No No No No  Number falls in past yr: - - - - -  Injury with [place]:   1. Routine physical examination   2. Pure hypercholesterolemia   3. History of endometrial cancer   4. History of colonic polyps   5. Generalized anxiety disorder   6. Family history of heart attack   7. Skin lesion    -anticipatory guidance provided --- exercise, weight loss, safe driving practices, aspirin 81mg  daily. -obtain age  appropriate  screening labs and labs for chronic disease management. -s/p TDAP -history of IA grade 1 endometrial cancer in 2012: pap smear obtained yet no longer warrants pap smear.  S/p total hysterectomy with BSO and washings.  Annual pelvic examinations recommended; vaginal cytology not indicated. -skin lesion R forearm changing: New; refer to dermatology. -alopecia: persistent; s/p dermatology consultation; patient has been holding statin therapy.   -hypercholesterolemia: obtain FLP.   Orders Placed This Encounter  Procedures  . Tdap vaccine greater than or equal to 7yo IM  . Comprehensive metabolic panel    Order Specific Question:   Has the patient fasted?    Answer:   No  . Lipid panel    Order Specific Question:   Has the patient fasted?    Answer:   No  . Ambulatory referral to Dermatology    Referral Priority:   Routine    Referral Type:   Consultation    Referral Reason:   Specialty Services Required    Requested Specialty:   Dermatology    Number of Visits Requested:   1  . POCT urinalysis dipstick  . EKG 12-Lead   Meds ordered this encounter  Medications  . DISCONTD: Zoster Vaccine Adjuvanted [facility]) injection    Sig: Inject 0.5 mLs into the muscle once for 1 dose.    Dispense:  0.5 mL    Refill:  1  . Zoster Vaccine Adjuvanted [facility]) injection    Sig: Inject 0.5 mLs into the muscle once for 1 dose.    Dispense:  0.5 mL    Refill:  1    Return in about 6 months (around 11/12/2018) for follow-up chronic medical conditions [place].   Numair Masden Elayne Guerin, M.D. Primary Care at [facility] previously [facility] [address] [place], [place]  [address] [phone] phone [phone] fax

## 2018-05-13 NOTE — Patient Instructions (Addendum)
IF you received an x-ray today, you will receive an invoice from [facility]. Please contact [facility] at [phone] with questions or concerns regarding your invoice.   IF you received labwork today, you will receive an invoice from [facility]. Please contact [facility] at [phone] with questions or concerns regarding your invoice.   Our billing staff will not be able to assist you with questions regarding bills from these companies.  You will be contacted with the lab results as soon as they are available. The fastest way to get your results is to activate your My Chart account. Instructions are located on the last page of this paperwork. If you have not heard from [place] regarding the results in 2 weeks, please contact this office.      Preventive Care 40-64 Years, Female Preventive care refers to lifestyle choices and visits with your health care provider that can promote health and wellness. What does preventive care include?  A yearly physical exam. This is also called an annual well check.  Dental exams once or twice a year.  Routine eye exams. Ask your health care provider how often you should have your eyes checked.  Personal lifestyle choices, including: ? Daily care of your teeth and gums. ? Regular physical activity. ? Eating a healthy diet. ? Avoiding tobacco and drug use. ? Limiting alcohol use. ? Practicing safe sex. ? Taking low-dose aspirin daily starting at age 6. ? Taking vitamin and mineral supplements as recommended by your health care provider. What happens during an annual well check? The services and screenings done by your health care provider during your annual well check will depend on your age, overall health, lifestyle risk factors, and family history of disease. Counseling Your health care provider may ask you questions about your:  Alcohol use.  Tobacco use.  Drug use.  Emotional well-being.  Home and relationship  well-being.  Sexual activity.  Eating habits.  Work and work Statistician.  Method of birth control.  Menstrual cycle.  Pregnancy history.  Screening You may have the following tests or measurements:  Height, weight, and BMI.  Blood pressure.  Lipid and cholesterol levels. These may be checked every 5 years, or more frequently if you are over 24 years old.  Skin check.  Lung cancer screening. You may have this screening every year starting at age 67 if you have a 30-pack-year history of smoking and currently smoke or have quit within the past 15 years.  Fecal occult blood test (FOBT) of the stool. You may have this test every year starting at age 31.  Flexible sigmoidoscopy or colonoscopy. You may have a sigmoidoscopy every 5 years or a colonoscopy every 10 years starting at age 1.  Hepatitis C blood test.  Hepatitis B blood test.  Sexually transmitted disease (STD) testing.  Diabetes screening. This is done by checking your blood sugar (glucose) after you have not eaten for a while (fasting). You may have this done every 1-3 years.  Mammogram. This may be done every 1-2 years. Talk to your health care provider about when you should start having regular mammograms. This may depend on whether you have a family history of breast cancer.  BRCA-related cancer screening. This may be done if you have a family history of breast, ovarian, tubal, or peritoneal cancers.  Pelvic exam and Pap test. This may be done every 3 years starting at age 58. Starting at age 54, this may be done every 5 years if  you have a Pap test in combination with an HPV test.  Bone density scan. This is done to screen for osteoporosis. You may have this scan if you are at high risk for osteoporosis.  Discuss your test results, treatment options, and if necessary, the need for more tests with your health care provider. Vaccines Your health care provider may recommend certain vaccines, such  as:  Influenza vaccine. This is recommended every year.  Tetanus, diphtheria, and acellular pertussis (Tdap, Td) vaccine. You may need a Td booster every 10 years.  Varicella vaccine. You may need this if you have not been vaccinated.  Zoster vaccine. You may need this after age 77.  Measles, mumps, and rubella (MMR) vaccine. You may need at least one dose of MMR if you were born in 1957 or later. You may also need a second dose.  Pneumococcal 13-valent conjugate (PCV13) vaccine. You may need this if you have certain conditions and were not previously vaccinated.  Pneumococcal polysaccharide (PPSV23) vaccine. You may need one or two doses if you smoke cigarettes or if you have certain conditions.  Meningococcal vaccine. You may need this if you have certain conditions.  Hepatitis A vaccine. You may need this if you have certain conditions or if you travel or work in places where you may be exposed to hepatitis A.  Hepatitis B vaccine. You may need this if you have certain conditions or if you travel or work in places where you may be exposed to hepatitis B.  Haemophilus influenzae type b (Hib) vaccine. You may need this if you have certain conditions.  Talk to your health care provider about which screenings and vaccines you need and how often you need them. This information is not intended to replace advice given to you by your health care provider. Make sure you discuss any questions you have with your health care provider. Document Released: 12/17/2015 Document Revised: 08/09/2016 Document Reviewed: 09/21/2015 Elsevier Interactive Patient Education  [employer].

## 2018-05-14 LAB — COMPREHENSIVE METABOLIC PANEL
ALBUMIN: 4.6 g/dL (ref 3.6–4.8)
ALK PHOS: 94 IU/L (ref 39–117)
ALT: 10 IU/L (ref 0–32)
AST: 16 IU/L (ref 0–40)
Albumin/Globulin Ratio: 2.6 — ABNORMAL HIGH (ref 1.2–2.2)
BILIRUBIN TOTAL: 0.7 mg/dL (ref 0.0–1.2)
BUN/Creatinine Ratio: 22 (ref 12–28)
BUN: 16 mg/dL (ref 8–27)
CHLORIDE: 103 mmol/L (ref 96–106)
CO2: 23 mmol/L (ref 20–29)
CREATININE: 0.72 mg/dL (ref 0.57–1.00)
Calcium: 9.1 mg/dL (ref 8.7–10.3)
GFR calc Af Amer: 104 mL/min/{1.73_m2} (ref >59)
GFR calc non Af Amer: 90 mL/min/{1.73_m2} (ref >59)
GLUCOSE: 89 mg/dL (ref 65–99)
Globulin, Total: 1.8 g/dL (ref 1.5–4.5)
Potassium: 3.9 mmol/L (ref 3.5–5.2)
Sodium: 144 mmol/L (ref 134–144)
Total Protein: 6.4 g/dL (ref 6.0–8.5)

## 2018-05-14 LAB — SPECIMEN STATUS

## 2018-05-14 LAB — LIPID PANEL
Chol/HDL Ratio: 3.8 ratio (ref 0.0–4.4)
Cholesterol, Total: 286 mg/dL — ABNORMAL HIGH (ref 100–199)
HDL: 76 mg/dL (ref >39)
LDL Calculated: 196 mg/dL — ABNORMAL HIGH (ref 0–99)
Triglycerides: 72 mg/dL (ref 0–149)
VLDL Cholesterol Cal: 14 mg/dL (ref 5–40)

## 2018-05-15 LAB — PAP IG W/ RFLX HPV ASCU: PAP SMEAR COMMENT: 0

## 2018-06-28 ENCOUNTER — Encounter: Payer: Self-pay | Admitting: Family Medicine

## 2018-07-02 ENCOUNTER — Other Ambulatory Visit: Payer: Self-pay | Admitting: *Deleted

## 2018-07-02 DIAGNOSIS — F411 Generalized anxiety disorder: Secondary | ICD-10-CM

## 2018-07-02 MED ORDER — FLUOXETINE HCL 20 MG PO TABS: ORAL_TABLET | ORAL | 1 refills | Status: DC

## 2018-07-06 ENCOUNTER — Encounter: Payer: Self-pay | Admitting: Family Medicine

## 2018-07-06 ENCOUNTER — Other Ambulatory Visit: Payer: Self-pay | Admitting: Family Medicine

## 2018-07-06 MED ORDER — PRAVASTATIN SODIUM 40 MG PO TABS: 40.0000 mg | ORAL_TABLET | Freq: Every day | ORAL | 3 refills | Status: DC

## 2018-07-10 ENCOUNTER — Encounter: Payer: BLUE CROSS/BLUE SHIELD | Admitting: Family Medicine

## 2018-11-12 ENCOUNTER — Encounter: Payer: Self-pay | Admitting: Family Medicine

## 2018-11-12 ENCOUNTER — Other Ambulatory Visit: Payer: Self-pay

## 2018-11-12 ENCOUNTER — Ambulatory Visit (INDEPENDENT_AMBULATORY_CARE_PROVIDER_SITE_OTHER): Payer: BLUE CROSS/BLUE SHIELD | Admitting: Family Medicine

## 2018-11-12 VITALS — BP 138/80 | HR 63 | Temp 98.3°F | Resp 16 | Ht 61.5 in | Wt 129.4 lb

## 2018-11-12 DIAGNOSIS — F411 Generalized anxiety disorder: Secondary | ICD-10-CM

## 2018-11-12 DIAGNOSIS — Z23 Encounter for immunization: Secondary | ICD-10-CM | POA: Diagnosis not present

## 2018-11-12 DIAGNOSIS — E7800 Pure hypercholesterolemia, unspecified: Secondary | ICD-10-CM

## 2018-11-12 DIAGNOSIS — Z8542 Personal history of malignant neoplasm of other parts of uterus: Secondary | ICD-10-CM | POA: Diagnosis not present

## 2018-11-12 MED ORDER — FLUOXETINE HCL 20 MG PO TABS: ORAL_TABLET | ORAL | 3 refills | Status: DC

## 2018-11-12 MED ORDER — PRAVASTATIN SODIUM 40 MG PO TABS: 40.0000 mg | ORAL_TABLET | Freq: Every day | ORAL | 3 refills | Status: DC

## 2018-11-12 NOTE — Progress Notes (Signed)
12/10/20195:18 PM  Kelli Mclean [birth year], 62 y.o. female [id number]  Chief Complaint  Patient presents with  . New pt    estab care, former pt of Dr. Tamala Julian, but no issues to discuss today.    HPI:   Patient is a 62 y.o. female with past medical history significant for HLP, GAD, colonic polyps, endometrial cancer who presents today to establish care  Previous PCP Dr Tamala Julian Last OV June 2019  Restarted cholesterol medication - tolerating well Lab Results  Component Value Date   CHOL 286 (H) 05/13/2018   HDL 76 05/13/2018   LDLCALC 196 (H) 05/13/2018   TRIG 72 05/13/2018   CHOLHDL 3.8 05/13/2018    Does not see gyn or onc anymore, pelvic done in June Denies any weight loss, vaginal bleeding, abd pain, n/v or changes in bowels  Feels anxiety is overall well controlled She is exercising   Fall Risk  11/12/2018 05/13/2018 01/09/2018 10/02/2016 04/13/2016  Falls in the past year? 0 No No No No  Number falls in past yr: - - - - -  Injury with Fall? - - - - -  Comment - - - - -     Depression screen [facility] 2/9 11/12/2018 05/13/2018 01/09/2018  Decreased Interest 0 0 0  Down, Depressed, Hopeless 0 0 0  PHQ - 2 Score 0 0 0  Altered sleeping - - -  Tired, decreased energy - - -  Change in appetite - - -  Feeling bad or failure about yourself  - - -  Trouble concentrating - - -  Moving slowly or fidgety/restless - - -  Suicidal thoughts - - -  PHQ-9 Score - - -    No Known Allergies  Prior to Admission medications   Medication Sig Start Date End Date Taking? Authorizing Provider  aspirin 81 MG tablet Take 81 mg by mouth daily.   Yes [provider]  cholecalciferol (VITAMIN D) 1000 UNITS tablet Take 1,000 Units by mouth daily.   Yes [provider]  CINNAMON PO Take by mouth.   Yes [provider]  FLUoxetine (PROZAC) 20 MG tablet TAKE 1 TABLET(20 MG) BY MOUTH DAILY 07/02/18  Yes Stallings, Zoe A, MD  GLUCOSAMINE CHONDROITIN COMPLX PO Take by  mouth. Taking 2 daily   Yes [provider]  Multiple Vitamins-Minerals (MULTIVITAMIN WITH MINERALS) tablet Take 1 tablet by mouth daily.   Yes [provider]  pravastatin (PRAVACHOL) 40 MG tablet Take 1 tablet (40 mg total) by mouth daily. 07/06/18  Yes Wardell Honour, MD  Omega-3 1000 MG CAPS Take by mouth.    [provider]    Past Medical History:  Diagnosis Date  . Anxiety   . Endometrial cancer, grade I ([place]) 12/04/2010   s/p hysterectomy/BSO/washings;  [place] [facility] [place].  . Hyperlipidemia     Past Surgical History:  Procedure Laterality Date  . ABDOMINAL HYSTERECTOMY  12/04/2010   uterine cancer; B oophorectomy.  [facility] BREAST DUCTAL SYSTEM EXCISION Right 08/04/2015   Procedure: RIGHT NIPPLE DUCT EXCISION;  Surgeon: Donnie Mesa, MD;  Location: [facility];  Service: General;  Laterality: Right;  . BREAST EXCISIONAL BIOPSY Right 08/04/2015   duct excision  . FOOT SURGERY     RT  BONE SPUR    Social History   Tobacco Use  . Smoking status: Never Smoker  . Smokeless tobacco: Never Used  Substance Use Topics  . Alcohol use: No    Alcohol/week: 0.0 standard drinks  Family History  Problem Relation Age of Onset  . Heart disease Mother 74       CABG/CAD/AMI  . Hyperlipidemia Mother   . Heart failure Father   . Dementia Father   . Heart disease Paternal Grandfather   . Diabetes Brother   . Hyperlipidemia Brother   . Heart disease Brother 67       AMI x 2 with stenting  . Heart disease Maternal Grandfather   . Heart disease Brother   . Hyperlipidemia Brother   . Colon cancer Neg Hx     ROS Per hpi  OBJECTIVE:  Blood pressure 138/80, pulse 63, temperature 98.3 F (36.8 C), temperature source Oral, resp. rate 16, height 5' 1.5" (1.562 m), weight 129 lb 6.4 oz (58.7 kg), SpO2 98 %. Body mass index is 24.05 kg/m.    Wt Readings from Last 3 Encounters:  11/12/18 129 lb 6.4 oz (58.7 kg)  05/13/18 125 lb (56.7 kg)  01/09/18 127 lb 12.8 oz (58  kg)   Physical Exam  Constitutional: She is oriented to person, place, and time. She appears well-developed and well-nourished.  HENT:  Head: Normocephalic and atraumatic.  Mouth/Throat: Mucous membranes are normal.  Eyes: Pupils are equal, round, and reactive to light. Conjunctivae and EOM are normal. No scleral icterus.  Neck: Neck supple.  Pulmonary/Chest: Effort normal.  Neurological: She is alert and oriented to person, place, and time.  Skin: Skin is warm and dry.  Psychiatric: She has a normal mood and affect.  Nursing note and vitals reviewed.    ASSESSMENT and PLAN  1. Pure hypercholesterolemia Checking labs today, medications will be adjusted as needed.  - Care order/instruction: - Comprehensive metabolic panel - Lipid panel  2. Generalized anxiety disorder Controlled. Continue current regime.  - FLUoxetine (PROZAC) 20 MG tablet; TAKE 1 TABLET(20 MG) BY MOUTH DAILY  3. History of endometrial cancer I reviewed 2017 Society of Gynecological Oncologist guidelines Yearly pelvic exam with speculum recommended for surveillance. Next due June 2020  Other orders - Flu Vaccine QUAD 36+ mos IM - pravastatin (PRAVACHOL) 40 MG tablet; Take 1 tablet (40 mg total) by mouth daily.  Return in about 6 months (around 05/14/2019) for CPE.    Rutherford Guys, MD [facility] [place], [place] [address] Ph.  [phone] Fax [phone]

## 2018-11-12 NOTE — Patient Instructions (Signed)
° ° ° °  If you have lab work done today you will be contacted with your lab results within the next 2 weeks.  If you have not heard from us then please contact us. The fastest way to get your results is to register for My Chart. ° ° °IF you received an x-ray today, you will receive an invoice from [facility]. Please contact [facility] at [phone] with questions or concerns regarding your invoice.  ° °IF you received labwork today, you will receive an invoice from [facility]. Please contact [facility] at [phone] with questions or concerns regarding your invoice.  ° °Our billing staff will not be able to assist you with questions regarding bills from these companies. ° °You will be contacted with the lab results as soon as they are available. The fastest way to get your results is to activate your My Chart account. Instructions are located on the last page of this paperwork. If you have not heard from us regarding the results in 2 weeks, please contact this office. °  ° ° ° °

## 2018-11-13 LAB — COMPREHENSIVE METABOLIC PANEL
ALT: 15 IU/L (ref 0–32)
AST: 19 IU/L (ref 0–40)
Albumin/Globulin Ratio: 2.5 — ABNORMAL HIGH (ref 1.2–2.2)
Albumin: 4.3 g/dL (ref 3.6–4.8)
Alkaline Phosphatase: 92 IU/L (ref 39–117)
BUN/Creatinine Ratio: 21 (ref 12–28)
BUN: 16 mg/dL (ref 8–27)
Bilirubin Total: 0.4 mg/dL (ref 0.0–1.2)
CO2: 21 mmol/L (ref 20–29)
Calcium: 9 mg/dL (ref 8.7–10.3)
Chloride: 103 mmol/L (ref 96–106)
Creatinine, Ser: 0.77 mg/dL (ref 0.57–1.00)
GFR calc Af Amer: 95 mL/min/{1.73_m2} (ref >59)
GFR calc non Af Amer: 82 mL/min/{1.73_m2} (ref >59)
Globulin, Total: 1.7 g/dL (ref 1.5–4.5)
Glucose: 89 mg/dL (ref 65–99)
Potassium: 3.6 mmol/L (ref 3.5–5.2)
Sodium: 141 mmol/L (ref 134–144)
Total Protein: 6 g/dL (ref 6.0–8.5)

## 2018-11-13 LAB — LIPID PANEL
Chol/HDL Ratio: 3.1 ratio (ref 0.0–4.4)
Cholesterol, Total: 200 mg/dL — ABNORMAL HIGH (ref 100–199)
HDL: 64 mg/dL (ref >39)
LDL Calculated: 105 mg/dL — ABNORMAL HIGH (ref 0–99)
Triglycerides: 156 mg/dL — ABNORMAL HIGH (ref 0–149)
VLDL Cholesterol Cal: 31 mg/dL (ref 5–40)

## 2018-11-15 ENCOUNTER — Telehealth: Payer: Self-pay | Admitting: Emergency Medicine

## 2018-11-15 NOTE — Telephone Encounter (Signed)
Left a msg on patients machine to return call to let us know if she received her flu shot when she was seen on 11/12/2018. If she di not receive her flu shot she can stop by the office to get her flu shot.

## 2018-11-18 ENCOUNTER — Telehealth: Payer: Self-pay

## 2018-11-18 DIAGNOSIS — E7800 Pure hypercholesterolemia, unspecified: Secondary | ICD-10-CM | POA: Diagnosis not present

## 2018-11-18 DIAGNOSIS — Z23 Encounter for immunization: Secondary | ICD-10-CM

## 2018-11-18 NOTE — Telephone Encounter (Signed)
Pt is wanting to know did you get the information on urinary systems cancers she is requesting

## 2018-11-19 NOTE — Telephone Encounter (Signed)
I spoke with her via phone on the same day of our appointment I reviewed current guidelines for surveillance for endometrial cancer from the society of gynecological oncologist Yearly pelvic with speculum exam is all that is recommended thanks

## 2019-05-29 ENCOUNTER — Other Ambulatory Visit: Payer: Self-pay

## 2019-05-29 ENCOUNTER — Ambulatory Visit (INDEPENDENT_AMBULATORY_CARE_PROVIDER_SITE_OTHER): Payer: BC Managed Care – PPO | Admitting: Family Medicine

## 2019-05-29 ENCOUNTER — Encounter: Payer: Self-pay | Admitting: Family Medicine

## 2019-05-29 VITALS — BP 131/84 | HR 72 | Temp 98.9°F | Ht 61.5 in | Wt 122.0 lb

## 2019-05-29 DIAGNOSIS — Z01411 Encounter for gynecological examination (general) (routine) with abnormal findings: Secondary | ICD-10-CM

## 2019-05-29 DIAGNOSIS — F411 Generalized anxiety disorder: Secondary | ICD-10-CM | POA: Diagnosis not present

## 2019-05-29 DIAGNOSIS — E7800 Pure hypercholesterolemia, unspecified: Secondary | ICD-10-CM | POA: Diagnosis not present

## 2019-05-29 DIAGNOSIS — E559 Vitamin D deficiency, unspecified: Secondary | ICD-10-CM | POA: Diagnosis not present

## 2019-05-29 DIAGNOSIS — Z01419 Encounter for gynecological examination (general) (routine) without abnormal findings: Secondary | ICD-10-CM

## 2019-05-29 MED ORDER — SHINGRIX 50 MCG/0.5ML IM SUSR: 0.5000 mL | Freq: Once | INTRAMUSCULAR | 1 refills | Status: AC

## 2019-05-29 MED ORDER — FLUOXETINE HCL 20 MG PO TABS: ORAL_TABLET | ORAL | 3 refills | Status: DC

## 2019-05-29 MED ORDER — HYDROCORTISONE ACETATE 25 MG RE SUPP: 25.0000 mg | Freq: Two times a day (BID) | RECTAL | 2 refills | Status: DC

## 2019-05-29 MED ORDER — PRAVASTATIN SODIUM 40 MG PO TABS: 40.0000 mg | ORAL_TABLET | Freq: Every day | ORAL | 3 refills | Status: DC

## 2019-05-29 NOTE — Patient Instructions (Signed)
Preventive Care 40-64 Years, Female Preventive care refers to lifestyle choices and visits with your health care provider that can promote health and wellness. What does preventive care include?   A yearly physical exam. This is also called an annual well check.  Dental exams once or twice a year.  Routine eye exams. Ask your health care provider how often you should have your eyes checked.  Personal lifestyle choices, including: ? Daily care of your teeth and gums. ? Regular physical activity. ? Eating a healthy diet. ? Avoiding tobacco and drug use. ? Limiting alcohol use. ? Practicing safe sex. ? Taking low-dose aspirin daily starting at age 50. ? Taking vitamin and mineral supplements as recommended by your health care provider. What happens during an annual well check? The services and screenings done by your health care provider during your annual well check will depend on your age, overall health, lifestyle risk factors, and family history of disease. Counseling Your health care provider may ask you questions about your:  Alcohol use.  Tobacco use.  Drug use.  Emotional well-being.  Home and relationship well-being.  Sexual activity.  Eating habits.  Work and work environment.  Method of birth control.  Menstrual cycle.  Pregnancy history. Screening You may have the following tests or measurements:  Height, weight, and BMI.  Blood pressure.  Lipid and cholesterol levels. These may be checked every 5 years, or more frequently if you are over 50 years old.  Skin check.  Lung cancer screening. You may have this screening every year starting at age 55 if you have a 30-pack-year history of smoking and currently smoke or have quit within the past 15 years.  Colorectal cancer screening. All adults should have this screening starting at age 50 and continuing until age 75. Your health care provider may recommend screening at age 45. You will have tests every  1-10 years, depending on your results and the type of screening test. People at increased risk should start screening at an earlier age. Screening tests may include: ? Guaiac-based fecal occult blood testing. ? Fecal immunochemical test (FIT). ? Stool DNA test. ? Virtual colonoscopy. ? Sigmoidoscopy. During this test, a flexible tube with a tiny camera (sigmoidoscope) is used to examine your rectum and lower colon. The sigmoidoscope is inserted through your anus into your rectum and lower colon. ? Colonoscopy. During this test, a long, thin, flexible tube with a tiny camera (colonoscope) is used to examine your entire colon and rectum.  Hepatitis C blood test.  Hepatitis B blood test.  Sexually transmitted disease (STD) testing.  Diabetes screening. This is done by checking your blood sugar (glucose) after you have not eaten for a while (fasting). You may have this done every 1-3 years.  Mammogram. This may be done every 1-2 years. Talk to your health care provider about when you should start having regular mammograms. This may depend on whether you have a family history of breast cancer.  BRCA-related cancer screening. This may be done if you have a family history of breast, ovarian, tubal, or peritoneal cancers.  Pelvic exam and Pap test. This may be done every 3 years starting at age 21. Starting at age 30, this may be done every 5 years if you have a Pap test in combination with an HPV test.  Bone density scan. This is done to screen for osteoporosis. You may have this scan if you are at high risk for osteoporosis. Discuss your test results, treatment options,   and if necessary, the need for more tests with your health care provider. Vaccines Your health care provider may recommend certain vaccines, such as:  Influenza vaccine. This is recommended every year.  Tetanus, diphtheria, and acellular pertussis (Tdap, Td) vaccine. You may need a Td booster every 10 years.  Varicella  vaccine. You may need this if you have not been vaccinated.  Zoster vaccine. You may need this after age 38.  Measles, mumps, and rubella (MMR) vaccine. You may need at least one dose of MMR if you were born in 1957 or later. You may also need a second dose.  Pneumococcal 13-valent conjugate (PCV13) vaccine. You may need this if you have certain conditions and were not previously vaccinated.  Pneumococcal polysaccharide (PPSV23) vaccine. You may need one or two doses if you smoke cigarettes or if you have certain conditions.  Meningococcal vaccine. You may need this if you have certain conditions.  Hepatitis A vaccine. You may need this if you have certain conditions or if you travel or work in places where you may be exposed to hepatitis A.  Hepatitis B vaccine. You may need this if you have certain conditions or if you travel or work in places where you may be exposed to hepatitis B.  Haemophilus influenzae type b (Hib) vaccine. You may need this if you have certain conditions. Talk to your health care provider about which screenings and vaccines you need and how often you need them. This information is not intended to replace advice given to you by your health care provider. Make sure you discuss any questions you have with your health care provider. Document Released: 12/17/2015 Document Revised: 01/10/2018 Document Reviewed: 09/21/2015 Elsevier Interactive Patient Education  2019 [employer].

## 2019-05-29 NOTE — Progress Notes (Signed)
6/25/20204:43 PM  Kelli Mclean [birth year], 63 y.o., female [id number]  Chief Complaint  Patient presents with  . Annual Exam    last pap 05/2018, wants to know since she has had urinary cancer is she to rp pap often than normal  . Hemorrhoids    reoccuring , asking for anucort '25mg'$  just to have on hand    HPI:   Patient is a 63 y.o. female with past medical history significant for HLP, colonic polyps, GAD, endometrial cancer, vitamin D deficiency who presents today for CPE  Last CPE June 2019 Cervical Cancer Screening: 2019, negative, s/o hyst for uterine cancer in 2012 Breast Cancer Screening: march 2019, birads 1, pending scheduling this year Colorectal Cancer Screening: colonoscopy 2016, tubular adenoma, every 5 years Bone Density Testing: at age 28  Most Recent Immunizations  Administered Date(s) Administered  . Influenza,inj,Quad PF,6+ Mos 11/18/2018  . Tdap 05/13/2018  Pneumococcal Vaccination: at age 65 Zoster Vaccination: at pharmacy of choice Frequency of Dental evaluation: Q6 months Frequency of Eye evaluation: has not seen eye doctor  Depression screen [facility] 2/9 05/29/2019 05/29/2019 11/12/2018  Decreased Interest 0 0 0  Down, Depressed, Hopeless 0 0 0  PHQ - 2 Score 0 0 0  Altered sleeping 1 - -  Tired, decreased energy 1 - -  Change in appetite 0 - -  Feeling bad or failure about yourself  0 - -  Trouble concentrating 1 - -  Moving slowly or fidgety/restless 0 - -  Suicidal thoughts 0 - -  PHQ-9 Score 3 - -  Difficult doing work/chores Not difficult at all - -   GAD 7 : Generalized Anxiety Score 05/29/2019  Nervous, Anxious, on Edge 2  Control/stop worrying 1  Worry too much - different things 2  Trouble relaxing 0  Restless 0  Easily annoyed or irritable 1  Afraid - awful might happen 0  Total GAD 7 Score 6  Anxiety Difficulty Not difficult at all     Fall Risk  05/29/2019 11/12/2018 05/13/2018 01/09/2018 10/02/2016  Falls in the past year? 0 0  No No No  Number falls in past yr: 0 - - - -  Injury with Fall? 0 - - - -  Comment - - - - -     No Known Allergies  Prior to Admission medications   Medication Sig Start Date End Date Taking? Authorizing Provider  aspirin 81 MG tablet Take 81 mg by mouth daily.   Yes [provider]  Biotin 1 MG CAPS Take by mouth.   Yes [provider]  cholecalciferol (VITAMIN D) 1000 UNITS tablet Take 1,000 Units by mouth daily.   Yes [provider]  co-enzyme Q-10 30 MG capsule Take 30 mg by mouth 3 (three) times daily.   Yes [provider]  FLUoxetine (PROZAC) 20 MG tablet TAKE 1 TABLET(20 MG) BY MOUTH DAILY 11/12/18  Yes Rutherford Guys, MD  GLUCOSAMINE CHONDROITIN COMPLX PO Take by mouth. Taking 2 daily   Yes [provider]  Multiple Vitamins-Minerals (MULTIVITAMIN WITH MINERALS) tablet Take 1 tablet by mouth daily.   Yes [provider]  Omega-3 1000 MG CAPS Take by mouth.   Yes [provider]  pravastatin (PRAVACHOL) 40 MG tablet Take 1 tablet (40 mg total) by mouth daily. 11/12/18  Yes Rutherford Guys, MD    Past Medical History:  Diagnosis Date  . Anxiety   . Endometrial cancer, grade I ([place]) 12/04/2010  s/p hysterectomy/BSO/washings;  [place] [place].  . Hyperlipidemia     Past Surgical History:  Procedure Laterality Date  . ABDOMINAL HYSTERECTOMY  12/04/2010   uterine cancer; B oophorectomy.  [facility] BREAST DUCTAL SYSTEM EXCISION Right 08/04/2015   Procedure: RIGHT NIPPLE DUCT EXCISION;  Surgeon: Donnie Mesa, MD;  Location: [facility];  Service: General;  Laterality: Right;  . BREAST EXCISIONAL BIOPSY Right 08/04/2015   duct excision  . FOOT SURGERY     RT  BONE SPUR    Social History   Tobacco Use  . Smoking status: Never Smoker  . Smokeless tobacco: Never Used  Substance Use Topics  . Alcohol use: No    Alcohol/week: 0.0 standard drinks    Family History  Problem Relation Age of Onset  . Heart disease Mother 34        CABG/CAD/AMI  . Hyperlipidemia Mother   . Heart failure Father   . Dementia Father   . Heart disease Paternal Grandfather   . Diabetes Brother   . Hyperlipidemia Brother   . Heart disease Brother 24       AMI x 2 with stenting  . Heart disease Maternal Grandfather   . Heart disease Brother   . Hyperlipidemia Brother   . Colon cancer Neg Hx     Review of Systems  Constitutional: Negative for chills and fever.  Respiratory: Negative for cough and shortness of breath.   Cardiovascular: Negative for chest pain, palpitations and leg swelling.  Gastrointestinal: Negative for abdominal pain, nausea and vomiting.  All other systems reviewed and are negative.    OBJECTIVE:  Today's Vitals   05/29/19 1625  BP: 131/84  Pulse: 72  Temp: 98.9 F (37.2 C)  TempSrc: Oral  SpO2: 99%  Weight: 122 lb (55.3 kg)  Height: 5' 1.5" (1.562 m)   Body mass index is 22.68 kg/m.   Hearing Screening   '125Hz'$  '250Hz'$  '500Hz'$  '1000Hz'$  '2000Hz'$  '3000Hz'$  '4000Hz'$  '6000Hz'$  '8000Hz'$   Right ear:           Left ear:             Visual Acuity Screening   Right eye Left eye Both eyes  Without correction: '20/40 20/40 20/40 '$  With correction:       Physical Exam Vitals signs and nursing note reviewed. Exam conducted with a chaperone present.  Constitutional:      Appearance: She is well-developed.  HENT:     Head: Normocephalic and atraumatic.     Right Ear: Hearing, tympanic membrane, ear canal and external ear normal.     Left Ear: Hearing, tympanic membrane, ear canal and external ear normal.  Eyes:     Conjunctiva/sclera: Conjunctivae normal.     Pupils: Pupils are equal, round, and reactive to light.  Neck:     Thyroid: No thyromegaly.  Cardiovascular:     Rate and Rhythm: Normal rate and regular rhythm.     Heart sounds: Normal heart sounds. No murmur. No friction rub. No gallop.   Pulmonary:     Effort: Pulmonary effort is normal.     Breath sounds: Normal breath sounds. No wheezing, rhonchi  or rales.  Chest:     Breasts: Breasts are symmetrical.        Right: No inverted nipple, mass, nipple discharge, skin change or tenderness.        Left: No inverted nipple, mass, nipple discharge, skin change or tenderness.  Abdominal:     General: Bowel sounds are normal. There  is no distension.     Palpations: Abdomen is soft.     Tenderness: There is no abdominal tenderness. There is no guarding.  Genitourinary:    Labia:        Right: No rash or lesion.        Left: No rash or lesion.      Vagina: No vaginal discharge or erythema.     Comments: Cervix, uterus and adnexa surgically absent Vaginal cuff wo lesions or changes Musculoskeletal: Normal range of motion.  Lymphadenopathy:     Cervical: No cervical adenopathy.     Upper Body:     Right upper body: No supraclavicular, axillary or pectoral adenopathy.     Left upper body: No supraclavicular, axillary or pectoral adenopathy.  Skin:    General: Skin is warm and dry.  Neurological:     Mental Status: She is alert and oriented to person, place, and time.     Deep Tendon Reflexes: Reflexes are normal and symmetric.     ASSESSMENT and PLAN  1. Women's annual routine gynecological examination Routine HCM labs ordered. HCM reviewed/discussed. Anticipatory guidance regarding healthy weight, lifestyle and choices given.   2. Pure hypercholesterolemia Checking labs today, medications will be adjusted as needed.  - Lipid panel - TSH - CMP14+EGFR  3. Vitamin D deficiency - VITAMIN D 25 Hydroxy (Vit-D Deficiency, Fractures)  4. Generalized anxiety disorder Controlled. Continue current regime.  - FLUoxetine (PROZAC) 20 MG tablet; TAKE 1 TABLET(20 MG) BY MOUTH DAILY  Other orders - Zoster Vaccine Adjuvanted [facility]) injection; Inject 0.5 mLs into the muscle once for 1 dose. - pravastatin (PRAVACHOL) 40 MG tablet; Take 1 tablet (40 mg total) by mouth daily. - hydrocortisone (ANUSOL-HC) 25 MG suppository; Place 1  suppository (25 mg total) rectally 2 (two) times daily.  Return in about 6 months (around 11/28/2019).    Rutherford Guys, MD Primary Care at [facility] [place], [place] [address] Ph.  [phone] Fax [phone]

## 2019-05-30 LAB — CMP14+EGFR
ALT: 23 IU/L (ref 0–32)
AST: 25 IU/L (ref 0–40)
Albumin/Globulin Ratio: 2.4 — ABNORMAL HIGH (ref 1.2–2.2)
Albumin: 4.6 g/dL (ref 3.8–4.8)
Alkaline Phosphatase: 92 IU/L (ref 39–117)
BUN/Creatinine Ratio: 22 (ref 12–28)
BUN: 17 mg/dL (ref 8–27)
Bilirubin Total: 0.8 mg/dL (ref 0.0–1.2)
CO2: 22 mmol/L (ref 20–29)
Calcium: 9.4 mg/dL (ref 8.7–10.3)
Chloride: 102 mmol/L (ref 96–106)
Creatinine, Ser: 0.76 mg/dL (ref 0.57–1.00)
GFR calc Af Amer: 96 mL/min/{1.73_m2} (ref >59)
GFR calc non Af Amer: 83 mL/min/{1.73_m2} (ref >59)
Globulin, Total: 1.9 g/dL (ref 1.5–4.5)
Glucose: 90 mg/dL (ref 65–99)
Potassium: 4 mmol/L (ref 3.5–5.2)
Sodium: 141 mmol/L (ref 134–144)
Total Protein: 6.5 g/dL (ref 6.0–8.5)

## 2019-05-30 LAB — VITAMIN D 25 HYDROXY (VIT D DEFICIENCY, FRACTURES): Vit D, 25-Hydroxy: 48.1 ng/mL (ref 30.0–100.0)

## 2019-05-30 LAB — LIPID PANEL
Chol/HDL Ratio: 3.1 ratio (ref 0.0–4.4)
Cholesterol, Total: 221 mg/dL — ABNORMAL HIGH (ref 100–199)
HDL: 71 mg/dL (ref >39)
LDL Calculated: 135 mg/dL — ABNORMAL HIGH (ref 0–99)
Triglycerides: 74 mg/dL (ref 0–149)
VLDL Cholesterol Cal: 15 mg/dL (ref 5–40)

## 2019-05-30 LAB — TSH: TSH: 0.982 u[IU]/mL (ref 0.450–4.500)

## 2019-06-03 ENCOUNTER — Other Ambulatory Visit: Payer: Self-pay | Admitting: Family Medicine

## 2019-06-03 MED ORDER — ATORVASTATIN CALCIUM 40 MG PO TABS: 40.0000 mg | ORAL_TABLET | Freq: Every day | ORAL | 3 refills | Status: DC

## 2019-11-04 ENCOUNTER — Encounter: Payer: Self-pay | Admitting: Family Medicine

## 2019-11-04 ENCOUNTER — Ambulatory Visit (INDEPENDENT_AMBULATORY_CARE_PROVIDER_SITE_OTHER): Payer: BC Managed Care – PPO | Admitting: Family Medicine

## 2019-11-04 ENCOUNTER — Other Ambulatory Visit: Payer: Self-pay

## 2019-11-04 VITALS — BP 122/84 | HR 67 | Temp 98.2°F | Ht 61.5 in | Wt 122.0 lb

## 2019-11-04 DIAGNOSIS — M791 Myalgia, unspecified site: Secondary | ICD-10-CM

## 2019-11-04 DIAGNOSIS — S8991XA Unspecified injury of right lower leg, initial encounter: Secondary | ICD-10-CM

## 2019-11-04 DIAGNOSIS — T466X5A Adverse effect of antihyperlipidemic and antiarteriosclerotic drugs, initial encounter: Secondary | ICD-10-CM | POA: Diagnosis not present

## 2019-11-04 DIAGNOSIS — T148XXA Other injury of unspecified body region, initial encounter: Secondary | ICD-10-CM

## 2019-11-04 MED ORDER — PRAVASTATIN SODIUM 40 MG PO TABS: 20.0000 mg | ORAL_TABLET | Freq: Every day | ORAL | 3 refills | Status: DC

## 2019-11-04 MED ORDER — PRAVASTATIN SODIUM 40 MG PO TABS: 40.0000 mg | ORAL_TABLET | Freq: Every day | ORAL | 3 refills | Status: DC

## 2019-11-04 NOTE — Patient Instructions (Addendum)
If you have lab work done today you will be contacted with your lab results within the next 2 weeks.  If you have not heard from [place] then please contact us. The fastest way to get your results is to register for My Chart.   IF you received an x-ray today, you will receive an invoice from [facility]. Please contact [facility] at [phone] with questions or concerns regarding your invoice.   IF you received labwork today, you will receive an invoice from [facility]. Please contact [facility] at [phone] with questions or concerns regarding your invoice.   Our billing staff will not be able to assist you with questions regarding bills from these companies.  You will be contacted with the lab results as soon as they are available. The fastest way to get your results is to activate your My Chart account. Instructions are located on the last page of this paperwork. If you have not heard from [place] regarding the results in 2 weeks, please contact this office.     Hematoma A hematoma is a collection of blood under the skin, in an organ, in a body space, in a joint space, or in other tissue. The blood can thicken (clot) to form a lump that you can see and feel. The lump is often firm and may become sore and tender. Most hematomas get better in a few days to weeks. However, some hematomas may be serious and require medical care. Hematomas can range from very small to very large. What are the causes? This condition is caused by:  A blunt or penetrating injury.  A leakage from a blood vessel under the skin.  Some medical procedures, including surgeries, such as oral surgery, face lifts, and surgeries on the joints.  Some medical conditions that cause bleeding or bruising. There may be multiple hematomas that appear in different areas of the body. What increases the risk? You are more likely to develop this condition if:  You are an older adult.  You use blood  thinners. What are the signs or symptoms?  Symptoms of this condition depend on where the hematoma is located.  Common symptoms of a hematoma that is under the skin include:  A firm lump on the body.  Pain and tenderness in the area.  Bruising. Blue, dark blue, purple-red, or yellowish skin (discoloration) may appear at the site of the hematoma if the hematoma is close to the surface of the skin. Common symptoms of a hematoma that is deep in the tissues or body spaces may be less obvious. They include:  A collection of blood in the stomach (intra-abdominal hematoma). This may cause pain in the abdomen, weakness, fainting, and shortness of breath.  A collection of blood in the head (intracranial hematoma). This may cause a headache or symptoms such as weakness, trouble speaking or understanding, or a change in consciousness. How is this diagnosed? This condition is diagnosed based on:  Your medical history.  A physical exam.  Imaging tests, such as an ultrasound or CT scan. These may be needed if your health care provider suspects a hematoma in deeper tissues or body spaces.  Blood tests. These may be needed if your health care provider believes that the hematoma is caused by a medical condition. How is this treated? Treatment for this condition depends on the cause, size, and location of the hematoma. Treatment may include:  Doing nothing. The majority of hematomas do not need treatment as many of  them go away on their own over time.  Surgery or close monitoring. This may be needed for large hematomas or hematomas that affect vital organs.  Medicines. Medicines may be given if there is an underlying medical cause for the hematoma. Follow these instructions at home: Managing pain, stiffness, and swelling   If directed, put ice on the affected area. ? Put ice in a plastic bag. ? Place a towel between your skin and the bag. ? Leave the ice on for 20 minutes, 2-3 times a day for  the first couple of days.  If directed, apply heat to the affected area after applying ice for a couple of days. Use the heat source that your health care provider recommends, such as a moist heat pack or a heating pad. ? Place a towel between your skin and the heat source. ? Leave the heat on for 20-30 minutes. ? Remove the heat if your skin turns bright red. This is especially important if you are unable to feel pain, heat, or cold. You may have a greater risk of getting burned.  Raise (elevate) the affected area above the level of your heart while you are sitting or lying down.  If told, wrap the affected area with an elastic bandage. The bandage applies pressure (compression) to the area, which may help to reduce swelling and promote healing. Do not wrap the bandage too tightly around the affected area.  If your hematoma is on a leg or foot (lower extremity) and is painful, your health care provider may recommend crutches. Use them as told by your health care provider. General instructions  Take over-the-counter and prescription medicines only as told by your health care provider.  Keep all follow-up visits as told by your health care provider. This is important. Contact a health care provider if:  You have a fever.  The swelling or discoloration gets worse.  You develop more hematomas. Get help right away if:  Your pain is worse or your pain is not controlled with medicine.  Your skin over the hematoma breaks or starts bleeding.  Your hematoma is in your chest or abdomen and you have weakness, shortness of breath, or a change in consciousness.  You have a hematoma on your scalp that is caused by a fall or injury, and you also have: ? A headache that gets worse. ? Trouble speaking or understanding speech. ? Weakness. ? Change in alertness or consciousness. Summary  A hematoma is a collection of blood under the skin, in an organ, in a body space, in a joint space, or in  other tissue.  This condition usually does not need treatment because many hematomas go away on their own over time.  Large hematomas, or those that may affect vital organs, may need surgical drainage or monitoring. If the hematoma is caused by a medical condition, medicines may be prescribed.  Get help right away if your hematoma breaks or starts to bleed, you have shortness of breath, or you have a headache or trouble speaking after a fall. This information is not intended to replace advice given to you by your health care provider. Make sure you discuss any questions you have with your health care provider. Document Released: 07/04/2004 Document Revised: 04/25/2018 Document Reviewed: 04/25/2018 Elsevier Patient Education  2020 [employer].

## 2019-11-04 NOTE — Progress Notes (Signed)
12/1/20203:33 PM  Kelli Mclean 1956-03-17, 63 y.o., female [id number]  Chief Complaint  Patient presents with   Pain    pain inthe legs, swelling has gone down. Thinks she may have clots in right leg. Does lots of standing at wk    HPI:   Patient is a 63 y.o. female with past medical history significant for HLP, colonic polyps, GAD, endometrial cancer, vitamin D deficiency who presents today for swelling and pain in right leg  Last week had a a discrete area of swelling, purplish, knot This happened after standing for about 8 hours She wore her compression stockings Today area looks yellowish but leg is achy No erythema or warmth, no chest pain or SOB No h/o vte  However achiness started after changing from pravastatin to atorvastatin  Lab Results  Component Value Date   CHOL 221 (H) 05/29/2019   HDL 71 05/29/2019   LDLCALC 135 (H) 05/29/2019   TRIG 74 05/29/2019   CHOLHDL 3.1 05/29/2019    Depression screen PHQ 2/9 11/04/2019 05/29/2019 05/29/2019  Decreased Interest 0 0 0  Down, Depressed, Hopeless 0 0 0  PHQ - 2 Score 0 0 0  Altered sleeping - 1 -  Tired, decreased energy - 1 -  Change in appetite - 0 -  Feeling bad or failure about yourself  - 0 -  Trouble concentrating - 1 -  Moving slowly or fidgety/restless - 0 -  Suicidal thoughts - 0 -  PHQ-9 Score - 3 -  Difficult doing work/chores - Not difficult at all -    Fall Risk  11/04/2019 05/29/2019 11/12/2018 05/13/2018 01/09/2018  Falls in the past year? 0 0 0 No No  Number falls in past yr: 0 0 - - -  Injury with Fall? 0 0 - - -  Comment - - - - -     No Known Allergies  Prior to Admission medications   Medication Sig Start Date End Date Taking? Authorizing Provider  aspirin 81 MG tablet Take 81 mg by mouth daily.   Yes [provider]  atorvastatin (LIPITOR) 40 MG tablet Take 1 tablet (40 mg total) by mouth daily. 06/03/19  Yes Rutherford Guys, MD  Biotin 1 MG CAPS Take by mouth.   Yes  [provider]  cholecalciferol (VITAMIN D) 1000 UNITS tablet Take 1,000 Units by mouth daily.   Yes [provider]  co-enzyme Q-10 30 MG capsule Take 30 mg by mouth 3 (three) times daily.   Yes [provider]  FLUoxetine (PROZAC) 20 MG tablet TAKE 1 TABLET(20 MG) BY MOUTH DAILY 05/29/19  Yes Rutherford Guys, MD  GLUCOSAMINE CHONDROITIN COMPLX PO Take by mouth. Taking 2 daily   Yes [provider]  hydrocortisone (ANUSOL-HC) 25 MG suppository Place 1 suppository (25 mg total) rectally 2 (two) times daily. 05/29/19  Yes Rutherford Guys, MD  Multiple Vitamins-Minerals (MULTIVITAMIN WITH MINERALS) tablet Take 1 tablet by mouth daily.   Yes [provider]  Omega-3 1000 MG CAPS Take by mouth.   Yes [provider]    Past Medical History:  Diagnosis Date   Anxiety    Endometrial cancer, grade I ([place]) 12/04/2010   s/p hysterectomy/BSO/washings;  [place] [facility] [place].   Hyperlipidemia     Past Surgical History:  Procedure Laterality Date   ABDOMINAL HYSTERECTOMY  12/04/2010   uterine cancer; B oophorectomy.   BREAST DUCTAL SYSTEM EXCISION Right 08/04/2015   Procedure: RIGHT NIPPLE DUCT EXCISION;  Surgeon: Donnie Mesa, MD;  Location: [facility];  Service: General;  Laterality: Right;   BREAST EXCISIONAL BIOPSY Right 08/04/2015   duct excision   FOOT SURGERY     RT  BONE SPUR    Social History   Tobacco Use   Smoking status: Never Smoker   Smokeless tobacco: Never Used  Substance Use Topics   Alcohol use: No    Alcohol/week: 0.0 standard drinks    Family History  Problem Relation Age of Onset   Heart disease Mother 35       CABG/CAD/AMI   Hyperlipidemia Mother    Heart failure Father    Dementia Father    Heart disease Paternal Grandfather    Diabetes Brother    Hyperlipidemia Brother    Heart disease Brother 38       AMI x 2 with stenting   Heart disease Maternal Grandfather    Heart disease Brother     Hyperlipidemia Brother    Colon cancer Neg Hx     ROS Per hpi  OBJECTIVE:  Today's Vitals   11/04/19 1525  BP: 122/84  Pulse: 67  Temp: 98.2 F (36.8 C)  SpO2: 98%  Weight: 122 lb (55.3 kg)  Height: 5' 1.5" (1.562 m)   Body mass index is 22.68 kg/m.   Physical Exam Vitals signs and nursing note reviewed.  Constitutional:      Appearance: She is well-developed.  HENT:     Head: Normocephalic and atraumatic.     Mouth/Throat:     Pharynx: No oropharyngeal exudate.  Eyes:     General: No scleral icterus.    Conjunctiva/sclera: Conjunctivae normal.     Pupils: Pupils are equal, round, and reactive to light.  Neck:     Musculoskeletal: Neck supple.  Cardiovascular:     Rate and Rhythm: Normal rate and regular rhythm.     Heart sounds: Normal heart sounds. No murmur. No friction rub. No gallop.   Pulmonary:     Effort: Pulmonary effort is normal.     Breath sounds: Normal breath sounds. No wheezing or rales.  Musculoskeletal:     Right lower leg: No edema.     Left lower leg: No edema.     Comments: Right leg with yellowish hematoma ~ 2cm, upper right side of tibia No calf TTP, erythema or warmth No palpable cords Neg homans Right leg 32cm, left leg 31cm Many spider veins present  Skin:    General: Skin is warm and dry.  Neurological:     Mental Status: She is alert and oriented to person, place, and time.        No results found for this or any previous visit (from the past 24 hour(s)).  No results found.   ASSESSMENT and PLAN  1. Hematoma Discussed supportive measures  2. Myalgia due to statin Change back to pravastatin, increased dose   Other orders - pravastatin (PRAVACHOL) 40 MG tablet; Take 1 tablets (40 mg total) by mouth daily.  Return in about 1 week (around 11/11/2019).    Rutherford Guys, MD Primary Care at [facility], [place] [address] Ph.  [phone] Fax [phone]

## 2019-11-24 ENCOUNTER — Ambulatory Visit: Payer: BC Managed Care – PPO | Admitting: Family Medicine

## 2019-11-30 DIAGNOSIS — Z20828 Contact with and (suspected) exposure to other viral communicable diseases: Secondary | ICD-10-CM | POA: Diagnosis not present

## 2019-12-12 DIAGNOSIS — Z6822 Body mass index (BMI) 22.0-22.9, adult: Secondary | ICD-10-CM | POA: Diagnosis not present

## 2019-12-12 DIAGNOSIS — U071 COVID-19: Secondary | ICD-10-CM | POA: Diagnosis not present

## 2019-12-12 DIAGNOSIS — Z20828 Contact with and (suspected) exposure to other viral communicable diseases: Secondary | ICD-10-CM | POA: Diagnosis not present

## 2020-03-26 ENCOUNTER — Ambulatory Visit (INDEPENDENT_AMBULATORY_CARE_PROVIDER_SITE_OTHER): Payer: BC Managed Care – PPO | Admitting: Adult Health Nurse Practitioner

## 2020-03-26 ENCOUNTER — Encounter: Payer: Self-pay | Admitting: Adult Health Nurse Practitioner

## 2020-03-26 ENCOUNTER — Other Ambulatory Visit: Payer: Self-pay

## 2020-03-26 VITALS — BP 143/76 | HR 73 | Temp 98.2°F | Ht 62.0 in | Wt 131.2 lb

## 2020-03-26 DIAGNOSIS — R109 Unspecified abdominal pain: Secondary | ICD-10-CM | POA: Insufficient documentation

## 2020-03-26 DIAGNOSIS — N12 Tubulo-interstitial nephritis, not specified as acute or chronic: Secondary | ICD-10-CM | POA: Diagnosis not present

## 2020-03-26 DIAGNOSIS — F411 Generalized anxiety disorder: Secondary | ICD-10-CM | POA: Diagnosis not present

## 2020-03-26 HISTORY — DX: Tubulo-interstitial nephritis, not specified as acute or chronic: N12

## 2020-03-26 HISTORY — DX: Unspecified abdominal pain: R10.9

## 2020-03-26 LAB — POCT URINALYSIS DIP (MANUAL ENTRY)
Bilirubin, UA: NEGATIVE
Glucose, UA: NEGATIVE mg/dL
Ketones, POC UA: NEGATIVE mg/dL
Nitrite, UA: NEGATIVE
Protein Ur, POC: 30 mg/dL — AB
Spec Grav, UA: 1.025 (ref 1.010–1.025)
Urobilinogen, UA: 0.2 E.U./dL
pH, UA: 6.5 (ref 5.0–8.0)

## 2020-03-26 MED ORDER — PRAVASTATIN SODIUM 40 MG PO TABS: 40.0000 mg | ORAL_TABLET | Freq: Every day | ORAL | 3 refills | Status: DC

## 2020-03-26 MED ORDER — PHENAZOPYRIDINE HCL 200 MG PO TABS: 200.0000 mg | ORAL_TABLET | Freq: Three times a day (TID) | ORAL | 0 refills | Status: DC | PRN

## 2020-03-26 MED ORDER — FLUOXETINE HCL 20 MG PO TABS: ORAL_TABLET | ORAL | 3 refills | Status: DC

## 2020-03-26 MED ORDER — CIPROFLOXACIN HCL 500 MG PO TABS: 500.0000 mg | ORAL_TABLET | Freq: Two times a day (BID) | ORAL | 0 refills | Status: AC

## 2020-03-26 NOTE — Patient Instructions (Signed)
° ° ° °  If you have lab work done today you will be contacted with your lab results within the next 2 weeks.  If you have not heard from us then please contact us. The fastest way to get your results is to register for My Chart. ° ° °IF you received an x-ray today, you will receive an invoice from [facility]. Please contact [facility] at [phone] with questions or concerns regarding your invoice.  ° °IF you received labwork today, you will receive an invoice from [facility]. Please contact [facility] at [phone] with questions or concerns regarding your invoice.  ° °Our billing staff will not be able to assist you with questions regarding bills from these companies. ° °You will be contacted with the lab results as soon as they are available. The fastest way to get your results is to activate your My Chart account. Instructions are located on the last page of this paperwork. If you have not heard from us regarding the results in 2 weeks, please contact this office. °  ° ° ° °

## 2020-03-26 NOTE — Progress Notes (Signed)
03/26/2020  AIRI PHILLIPE 1956-10-01 [id number]  SUBJECTIVE: Kelli Mclean is a 64 y.o. female who complains of left flank pain for the better part of the last week.  Initially associated with very dark urine.  Patient hydrated and improved color but flank pain remained and intensified to where she was unable to sleep last night due to pain.  She does endorse some systemic symptoms of chills/sweats but also got her 2nd Covid immunization during the same period of time so unsure whether related.  Last UTI x 1 year ago. .   OBJECTIVE: Appears well, in no apparent distress.  Vital signs are normal. The abdomen is soft without tenderness, guarding, mass, rebound or organomegaly. No CVA tenderness or inguinal adenopathy noted. Urine dipstick shows positive for RBC's, positive for protein and positive for leukocytes.  Micro exam: not done.   ASSESSMENT: Pyelonephritis  PLAN:   She requests refills of current medications unrelated to today's medical evaluation. These medications and related lab tests were reviewed with her and refilled.  1. Flank pain   2. Pyelonephritis   3. Generalized anxiety disorder    Had to wait approximately 40 minutes and with continued glasses of water within that time to produce specimen large enough to send for culture.  Ultimately obtained and cx ordered.   Meds ordered this encounter  Medications  . ciprofloxacin (CIPRO) 500 MG tablet    Sig: Take 1 tablet (500 mg total) by mouth 2 (two) times daily for 5 days.    Dispense:  10 tablet    Refill:  0  . phenazopyridine (PYRIDIUM) 200 MG tablet    Sig: Take 1 tablet (200 mg total) by mouth 3 (three) times daily as needed for pain.    Dispense:  10 tablet    Refill:  0  . pravastatin (PRAVACHOL) 40 MG tablet    Sig: Take 1 tablet (40 mg total) by mouth daily.    Dispense:  90 tablet    Refill:  3    Please disregard previous prescription  . FLUoxetine (PROZAC) 20 MG tablet    Sig: TAKE 1  TABLET(20 MG) BY MOUTH DAILY    Dispense:  90 tablet    Refill:  3   Antibiotic - You have received the antibiotic Ciprofloxacin It may take several days before you feel any benefit from this medicine. Be sure to take all the medication prescribed, even if you are feeling better before it is finished. Do not drink alcohol while taking antibiotics.  It is unknown whether you will develop an adverse reaction (diarrhea, rash, nausea or vomiting) or an allergic reaction (trouble breathing, anaphylaxis).   Reviewed complication of tendon rupture and to be aware of any AT tenderness.  Patient verbalized understanding.   Answered all questions related to dosing of Tylenol, Motrin, and Pyridium prn for flank pain.  Strict instructions and precautions given for ER f/u if symptoms progress systemically including but not limited to fever, chills, fatigue, inability to urinate.   Advised increasing fluid intake so that urine stays light yellow to clear over the next few days.  Pa  A total of  minutes were spent face-to-face with the patient during this encounter and over half of that time was spent on counseling and coordination of care.  A total of 45 minutes was spent with the patient, greater than 50% of which was in counseling/coordination of care regarding chronic medical problems, review of most recent office visit  notes, review of most recent blood work results, review of all medications, review of new medication and side effects, ER precautions, lab orders today and need for follow-up.

## 2020-03-28 LAB — URINE CULTURE: Organism ID, Bacteria: NO GROWTH

## 2020-04-07 ENCOUNTER — Other Ambulatory Visit: Payer: Self-pay | Admitting: Family Medicine

## 2020-04-07 DIAGNOSIS — Z1231 Encounter for screening mammogram for malignant neoplasm of breast: Secondary | ICD-10-CM

## 2020-04-12 ENCOUNTER — Other Ambulatory Visit: Payer: Self-pay

## 2020-04-12 ENCOUNTER — Ambulatory Visit
Admission: RE | Admit: 2020-04-12 | Discharge: 2020-04-12 | Disposition: A | Discharge status: Home / Self Care | Payer: BC Managed Care – PPO | Source: Ambulatory Visit | Attending: Family Medicine | Admitting: Family Medicine

## 2020-04-12 DIAGNOSIS — Z1231 Encounter for screening mammogram for malignant neoplasm of breast: Secondary | ICD-10-CM | POA: Diagnosis not present

## 2020-04-16 ENCOUNTER — Telehealth: Payer: Self-pay | Admitting: Family Medicine

## 2020-04-16 NOTE — Telephone Encounter (Signed)
Medication Refill - Medication: ciproloxacin, phenazopyridine  Has the patient contacted their pharmacy? Yes.   Pt called stating that her kidney infection came back. Pt states that she is in pain and is requesting to have this sent in today. Please advise.  (Agent: If no, request that the patient contact the pharmacy for the refill.) (Agent: If yes, when and what did the pharmacy advise?)  Preferred Pharmacy (with phone number or street name):  [facility] [place], [address]  [place] [address]  Phone: [phone] Fax: [phone]  Not a 24 hour pharmacy; exact hours not known.     Agent: Please be advised that RX refills may take up to 3 business days. We ask that you follow-up with your pharmacy.

## 2020-04-16 NOTE — Telephone Encounter (Signed)
Please advise on if this abx can be refilled

## 2020-04-16 NOTE — Telephone Encounter (Signed)
Patient needs to be evaluated in clinic. thanks

## 2020-04-18 DIAGNOSIS — R300 Dysuria: Secondary | ICD-10-CM | POA: Diagnosis not present

## 2020-04-18 DIAGNOSIS — R319 Hematuria, unspecified: Secondary | ICD-10-CM | POA: Diagnosis not present

## 2020-04-19 NOTE — Telephone Encounter (Signed)
I have called the pt back and she was in terrible pain over the weekend and was [facility]. She has gotten some antibiotics and Phenazopyridine. There was blood in her urine and the [facility] has taken some cultures and test.   I have informed pt to give the medication a chance to work and to let them send [place] some records.   Pt stated understanding.

## 2020-04-19 NOTE — Telephone Encounter (Signed)
I spoke to provider and confirmed that the pt was not throwing up or having a fever. I relayed that if she were to develop those sx then she has been instructed to go to urgent care and not [facility].   Also if pt is not feeling any better then she is to call the office to make an office visit.   For now we are giving the antibiotic time to work.   Pt stated understanding.

## 2020-04-27 ENCOUNTER — Telehealth: Payer: Self-pay | Admitting: Family Medicine

## 2020-04-27 NOTE — Telephone Encounter (Signed)
Pt called state she need a A Referral For Ct Scam I talk to [facility] she state to made  a app with Doctor Pamella Pert which dee for [place] [facility]

## 2020-04-30 ENCOUNTER — Encounter: Payer: Self-pay | Admitting: Family Medicine

## 2020-04-30 ENCOUNTER — Ambulatory Visit (INDEPENDENT_AMBULATORY_CARE_PROVIDER_SITE_OTHER): Payer: BC Managed Care – PPO | Admitting: Family Medicine

## 2020-04-30 ENCOUNTER — Other Ambulatory Visit: Payer: Self-pay

## 2020-04-30 VITALS — BP 133/82 | HR 75 | Temp 98.1°F | Ht 62.0 in | Wt 131.0 lb

## 2020-04-30 DIAGNOSIS — E7800 Pure hypercholesterolemia, unspecified: Secondary | ICD-10-CM | POA: Diagnosis not present

## 2020-04-30 DIAGNOSIS — R0789 Other chest pain: Secondary | ICD-10-CM | POA: Diagnosis not present

## 2020-04-30 DIAGNOSIS — R109 Unspecified abdominal pain: Secondary | ICD-10-CM

## 2020-04-30 DIAGNOSIS — F411 Generalized anxiety disorder: Secondary | ICD-10-CM | POA: Diagnosis not present

## 2020-04-30 DIAGNOSIS — N1330 Unspecified hydronephrosis: Secondary | ICD-10-CM

## 2020-04-30 LAB — POCT URINALYSIS DIP (MANUAL ENTRY)
Bilirubin, UA: NEGATIVE
Glucose, UA: NEGATIVE mg/dL
Ketones, POC UA: NEGATIVE mg/dL
Nitrite, UA: NEGATIVE
Protein Ur, POC: NEGATIVE mg/dL
Spec Grav, UA: 1.02 (ref 1.010–1.025)
Urobilinogen, UA: 0.2 E.U./dL
pH, UA: 6 (ref 5.0–8.0)

## 2020-04-30 LAB — POC MICROSCOPIC URINALYSIS (UMFC): Mucus: ABSENT

## 2020-04-30 MED ORDER — FLUOXETINE HCL 40 MG PO CAPS
40.0000 mg | ORAL_CAPSULE | Freq: Every day | ORAL | 3 refills | Status: DC
Start: 2020-04-30 — End: 2022-08-18

## 2020-04-30 NOTE — Progress Notes (Signed)
5/28/202110:09 AM  Kelli Mclean [birth year], 64 y.o., female [id number]  Chief Complaint  Patient presents with  . left side kidney pain off and on going this    kidney doctor   . Chest Pain    noticable possible stress     HPI:   Patient is a 64 y.o. female with past medical history significant for HLP, GAD, colonic polyps, endometrial cancer, vitamin D deficiency who presents today with several concerns  Last OV April 2021 - treated with bactrim for presumed UTI Sx resolved but returned about a week after completing abx Urine cx was negative in April She went to urgent care - no UTI but she had blood again in her urine Left sided dull constant pain, with intermittent increase intensity, now mostly pressure Pain is low back pain No dysuria or hematuria Pain does not radiate He brother has [place]   Patient has noticed several episodes of chest pressure Had this morning when she was taking a shower and was running her mental to do list Last about 5 minutes, tends to come in several episodes at a time Does not radiate Not associated with nausea, SOB, palpitations, light headed Not worse with exertion, she has been moving tree limbs for past 3 days wo issues She is under significant amount of stress  Patient has not had pelvic exam since her hyst for uterine cancer  Depression screen [facility] 2/9 03/26/2020 11/04/2019 05/29/2019  Decreased Interest 0 0 0  Down, Depressed, Hopeless 0 0 0  PHQ - 2 Score 0 0 0  Altered sleeping - - 1  Tired, decreased energy - - 1  Change in appetite - - 0  Feeling bad or failure about yourself  - - 0  Trouble concentrating - - 1  Moving slowly or fidgety/restless - - 0  Suicidal thoughts - - 0  PHQ-9 Score - - 3  Difficult doing work/chores - - Not difficult at all    Fall Risk  04/30/2020 03/26/2020 11/04/2019 05/29/2019 11/12/2018  Falls in the past year? 0 0 0 0 0  Number falls in past yr: 0 0 0 0 -  Injury with Fall? 0 0 0 0 -  Comment  - - - - -  Follow up Falls evaluation completed Falls evaluation completed - - -     No Known Allergies  Prior to Admission medications   Medication Sig Start Date End Date Taking? Authorizing Provider  aspirin 81 MG tablet Take 81 mg by mouth daily.   Yes [provider]  Biotin 1 MG CAPS Take by mouth.   Yes [provider]  FLUoxetine (PROZAC) 20 MG tablet TAKE 1 TABLET(20 MG) BY MOUTH DAILY 03/26/20  Yes Wendall Mola, NP  GLUCOSAMINE CHONDROITIN COMPLX PO Take by mouth. Taking 2 daily   Yes [provider]  hydrocortisone (ANUSOL-HC) 25 MG suppository Place 1 suppository (25 mg total) rectally 2 (two) times daily. 05/29/19  Yes Rutherford Guys, MD  Multiple Vitamins-Minerals (MULTIVITAMIN WITH MINERALS) tablet Take 1 tablet by mouth daily.   Yes [provider]  Omega-3 1000 MG CAPS Take by mouth.   Yes [provider]  pravastatin (PRAVACHOL) 40 MG tablet Take 1 tablet (40 mg total) by mouth daily. 03/26/20 06/24/20 Yes Wendall Mola, NP    Past Medical History:  Diagnosis Date  . Anxiety   . Endometrial cancer, grade I ([place]) 12/04/2010   s/p hysterectomy/BSO/washings;  [place].  . Hyperlipidemia  Past Surgical History:  Procedure Laterality Date  . ABDOMINAL HYSTERECTOMY  12/04/2010   uterine cancer; B oophorectomy.  [facility] BREAST DUCTAL SYSTEM EXCISION Right 08/04/2015   Procedure: RIGHT NIPPLE DUCT EXCISION;  Surgeon: Donnie Mesa, MD;  Location: [facility];  Service: General;  Laterality: Right;  . BREAST EXCISIONAL BIOPSY Right 08/04/2015   duct excision  . FOOT SURGERY     RT  BONE SPUR    Social History   Tobacco Use  . Smoking status: Never Smoker  . Smokeless tobacco: Never Used  Substance Use Topics  . Alcohol use: No    Alcohol/week: 0.0 standard drinks    Family History  Problem Relation Age of Onset  . Heart disease Mother 76       CABG/CAD/AMI  . Hyperlipidemia Mother   . Heart failure Father     . Dementia Father   . Heart disease Paternal Grandfather   . Diabetes Brother   . Hyperlipidemia Brother   . Heart disease Brother 69       AMI x 2 with stenting  . Heart disease Maternal Grandfather   . Heart disease Brother   . Hyperlipidemia Brother   . Colon cancer Neg Hx     ROS Per hpi  OBJECTIVE:  Today's Vitals   04/30/20 0959  BP: 133/82  Pulse: 75  Temp: 98.1 F (36.7 C)  TempSrc: Temporal  SpO2: 98%  Weight: 131 lb (59.4 kg)  Height: 5\' 2"  (1.575 m)   Body mass index is 23.96 kg/m.   Physical Exam Vitals and nursing note reviewed.  Constitutional:      Appearance: She is well-developed.  HENT:     Head: Normocephalic and atraumatic.     Mouth/Throat:     Pharynx: No oropharyngeal exudate.  Eyes:     General: No scleral icterus.    Conjunctiva/sclera: Conjunctivae normal.     Pupils: Pupils are equal, round, and reactive to light.  Cardiovascular:     Rate and Rhythm: Normal rate and regular rhythm.     Heart sounds: Normal heart sounds. No murmur. No friction rub. No gallop.   Pulmonary:     Effort: Pulmonary effort is normal.     Breath sounds: Normal breath sounds. No wheezing or rales.  Abdominal:     Tenderness: There is no right CVA tenderness or left CVA tenderness.  Musculoskeletal:     Cervical back: Neck supple.     Lumbar back: Normal. No spasms or tenderness.     Right lower leg: No edema.     Left lower leg: No edema.  Skin:    General: Skin is warm and dry.  Neurological:     Mental Status: She is alert and oriented to person, place, and time.     Results for orders placed or performed in visit on 04/30/20 (from the past 24 hour(s))  POCT urinalysis dipstick     Status: Abnormal   Collection Time: 04/30/20 10:15 AM  Result Value Ref Range   Color, UA yellow yellow   Clarity, UA clear clear   Glucose, UA negative negative mg/dL   Bilirubin, UA negative negative   Ketones, POC UA negative negative mg/dL   Spec Grav, UA  1.020 1.010 - 1.025   Blood, UA trace-intact (A) negative   pH, UA 6.0 5.0 - 8.0   Protein Ur, POC negative negative mg/dL   Urobilinogen, UA 0.2 0.2 or 1.0 E.U./dL   Nitrite, UA Negative Negative   Leukocytes,  UA Trace (A) Negative  POCT Microscopic Urinalysis ([facility])     Status: Abnormal   Collection Time: 04/30/20 10:32 AM  Result Value Ref Range   WBC,UR,HPF,POC Few (A) None WBC/hpf   RBC,UR,HPF,POC None None RBC/hpf   Bacteria Few (A) None, Too numerous to count   Mucus Absent Absent   Epithelial Cells, UR Per Microscopy None None, Too numerous to count cells/hpf    No results found.   ASSESSMENT and PLAN  1. Left flank pain Persistent flank pain with episodes of microscopic hematuria. Passed kidney stone?  [place] pending.  - POCT urinalysis dipstick - POCT Microscopic Urinalysis ([facility]) - US Renal; Future - CBC  2. Chest pressure Atypical. Patient with METS > 4 wo symptoms. EKG not available today. Low suspicion for CAD. Strict ER precautions given.   3. Generalized anxiety disorder Not well controlled. Increase prozac to 40mg  daily. - TSH  4. Pure hypercholesterolemia Checking labs today, medications will be adjusted as needed.  - Comprehensive metabolic panel - Lipid panel  Other orders - FLUoxetine (PROZAC) 40 MG capsule; Take 1 capsule (40 mg total) by mouth daily.  Return in about 2 weeks (around 05/14/2020) for anxiety, pelvic exam, ekg.    Rutherford Guys, MD [facility] at [facility] [place], [place] [address] Ph.  [phone] Fax [phone]

## 2020-04-30 NOTE — Patient Instructions (Signed)
° ° ° °  If you have lab work done today you will be contacted with your lab results within the next 2 weeks.  If you have not heard from us then please contact us. The fastest way to get your results is to register for My Chart. ° ° °IF you received an x-ray today, you will receive an invoice from [facility]. Please contact  Radiology at [phone] with questions or concerns regarding your invoice.  ° °IF you received labwork today, you will receive an invoice from [facility]. Please contact [facility] at [phone] with questions or concerns regarding your invoice.  ° °Our billing staff will not be able to assist you with questions regarding bills from these companies. ° °You will be contacted with the lab results as soon as they are available. The fastest way to get your results is to activate your My Chart account. Instructions are located on the last page of this paperwork. If you have not heard from us regarding the results in 2 weeks, please contact this office. °  ° ° ° °

## 2020-05-01 LAB — CBC
Hematocrit: 38.9 % (ref 34.0–46.6)
Hemoglobin: 13.1 g/dL (ref 11.1–15.9)
MCH: 30.8 pg (ref 26.6–33.0)
MCHC: 33.7 g/dL (ref 31.5–35.7)
MCV: 92 fL (ref 79–97)
Platelets: 242 10*3/uL (ref 150–450)
RBC: 4.25 x10E6/uL (ref 3.77–5.28)
RDW: 12.5 % (ref 11.7–15.4)
WBC: 5.1 10*3/uL (ref 3.4–10.8)

## 2020-05-01 LAB — COMPREHENSIVE METABOLIC PANEL
ALT: 18 IU/L (ref 0–32)
AST: 29 IU/L (ref 0–40)
Albumin/Globulin Ratio: 2.4 — ABNORMAL HIGH (ref 1.2–2.2)
Albumin: 4.6 g/dL (ref 3.8–4.8)
Alkaline Phosphatase: 108 IU/L (ref 48–121)
BUN/Creatinine Ratio: 21 (ref 12–28)
BUN: 19 mg/dL (ref 8–27)
Bilirubin Total: 0.6 mg/dL (ref 0.0–1.2)
CO2: 21 mmol/L (ref 20–29)
Calcium: 9.2 mg/dL (ref 8.7–10.3)
Chloride: 101 mmol/L (ref 96–106)
Creatinine, Ser: 0.92 mg/dL (ref 0.57–1.00)
GFR calc Af Amer: 76 mL/min/{1.73_m2} (ref >59)
GFR calc non Af Amer: 66 mL/min/{1.73_m2} (ref >59)
Globulin, Total: 1.9 g/dL (ref 1.5–4.5)
Glucose: 90 mg/dL (ref 65–99)
Potassium: 4.4 mmol/L (ref 3.5–5.2)
Sodium: 138 mmol/L (ref 134–144)
Total Protein: 6.5 g/dL (ref 6.0–8.5)

## 2020-05-01 LAB — LIPID PANEL
Chol/HDL Ratio: 3.7 ratio (ref 0.0–4.4)
Cholesterol, Total: 247 mg/dL — ABNORMAL HIGH (ref 100–199)
HDL: 67 mg/dL (ref >39)
LDL Chol Calc (NIH): 165 mg/dL — ABNORMAL HIGH (ref 0–99)
Triglycerides: 88 mg/dL (ref 0–149)
VLDL Cholesterol Cal: 15 mg/dL (ref 5–40)

## 2020-05-01 LAB — TSH: TSH: 1.04 u[IU]/mL (ref 0.450–4.500)

## 2020-05-10 ENCOUNTER — Ambulatory Visit
Admission: RE | Admit: 2020-05-10 | Discharge: 2020-05-10 | Disposition: A | Discharge status: Home / Self Care | Payer: BC Managed Care – PPO | Source: Ambulatory Visit | Attending: Family Medicine | Admitting: Family Medicine

## 2020-05-10 DIAGNOSIS — N1330 Unspecified hydronephrosis: Secondary | ICD-10-CM | POA: Diagnosis not present

## 2020-05-10 DIAGNOSIS — R109 Unspecified abdominal pain: Secondary | ICD-10-CM

## 2020-05-11 MED ORDER — ROSUVASTATIN CALCIUM 20 MG PO TABS: 20.0000 mg | ORAL_TABLET | Freq: Every day | ORAL | 3 refills | Status: DC

## 2020-05-11 NOTE — Addendum Note (Signed)
Addended by: Rutherford Guys on: 05/11/2020 06:34 PM   Modules accepted: Orders

## 2020-05-14 ENCOUNTER — Ambulatory Visit (INDEPENDENT_AMBULATORY_CARE_PROVIDER_SITE_OTHER): Payer: BC Managed Care – PPO | Admitting: Family Medicine

## 2020-05-14 ENCOUNTER — Encounter: Payer: Self-pay | Admitting: Family Medicine

## 2020-05-14 ENCOUNTER — Other Ambulatory Visit: Payer: Self-pay

## 2020-05-14 VITALS — BP 138/77 | HR 67 | Temp 98.4°F | Ht 62.0 in | Wt 131.0 lb

## 2020-05-14 DIAGNOSIS — R319 Hematuria, unspecified: Secondary | ICD-10-CM | POA: Diagnosis not present

## 2020-05-14 DIAGNOSIS — N1330 Unspecified hydronephrosis: Secondary | ICD-10-CM

## 2020-05-14 NOTE — Patient Instructions (Addendum)
  You have already been [facility] by [facility].  That referral has been submitted and you could probably call over there to expedite setting up a time that works with your schedule.  Their number is [phone].  We have scheduled you for a CT scan of the abdomen and pelvis to get a good view of your ureter.  They probably will need to give you some kind of a contrast to better delineate the ureter, but I ordered it in a fashion that will allow them to decide what is best to get the best views.  In the event of acute worsening return sooner.  Otherwise I would expect that the urologist will probably take over from here on decisions regarding what is causing the hydronephrosis.   If you have lab work done today you will be contacted with your lab results within the next 2 weeks.  If you have not heard from [place] then please contact us. The fastest way to get your results is to register for My Chart.   IF you received an x-ray today, you will receive an invoice from [facility]. Please contact [facility] at [phone] with questions or concerns regarding your invoice.   IF you received labwork today, you will receive an invoice from [facility]. Please contact [facility] at [phone] with questions or concerns regarding your invoice.   Our billing staff will not be able to assist you with questions regarding bills from these companies.  You will be contacted with the lab results as soon as they are available. The fastest way to get your results is to activate your My Chart account. Instructions are located on the last page of this paperwork. If you have not heard from [place] regarding the results in 2 weeks, please contact this office.

## 2020-05-14 NOTE — Progress Notes (Signed)
Patient ID: Kelli Mclean, female    DOB: September 11, 1956  Age: 64 y.o. MRN: [id number]  Chief Complaint  Patient presents with  . Follow-up    x2 weeks  pelvic exam. Pt stated that from the [place] that she had previous, her Lt kidney was enlarged. There was also previous hematuria and this was the reason for the [place].     Subjective:   Patient is here for a follow-up with regard to her ultrasound.  She has been documented to have a left hydronephrosis.  Pain has been intermittent.  Referral has already been made to a urologist but she has not yet received that.  The ultrasound report indicated that they would recommend a CT of her abdomen pelvis to further assess this.  Current allergies, medications, problem list, past/family and social histories reviewed.  Objective:  BP 138/77 (BP Location: Right Arm, Patient Position: Sitting, Cuff Size: Normal)   Pulse 67   Temp 98.4 F (36.9 C) (Temporal)   Ht 5\' 2"  (1.575 m)   Wt 131 lb (59.4 kg)   SpO2 95%   BMI 23.96 kg/m   Pleasant lady in no major distress.  No CVA tenderness right now.  Abdomen soft without mass or tenderness.  Pelvic normal external genitalia she has had a hysterectomy.  She has a very floppy vault to the vagina which folds and a lot makes it hard to see the apex of it but everything appears normal.  Bimanual exam is no vaginal or adnexal masses or lesions.  Rectovaginal was also unremarkable.  Assessment & Plan:   Assessment: 1. Hydronephrosis, unspecified hydronephrosis type   2. Hematuria, unspecified type       Plan: See instructions.  Orders Placed This Encounter  Procedures  . CT ABDOMEN PELVIS W WO CONTRAST    Radiologist suggested the CT scan.  Please do any necessary contrast per radiology protocol to visual ureter.    Standing Status:   Future    Standing Expiration Date:   05/14/2021    Order Specific Question:   ** REASON FOR EXAM (FREE TEXT)    Answer:   left hydronephrosis on ultrasound/pain    Order  Specific Question:   If indicated for the ordered procedure, I authorize the administration of contrast media per Radiology protocol    Answer:   Yes    Order Specific Question:   Preferred imaging location?    Answer:   External    Order Specific Question:   Is Oral Contrast requested for this exam?    Answer:   Yes, Per Radiology protocol    Order Specific Question:   Radiology Contrast Protocol - do NOT remove file path    Answer:   [email or web address]  . POCT urinalysis dipstick    No orders of the defined types were placed in this encounter.        Patient Instructions    You have already been referred by Dr. [facility]ella [facility].  That referral has been submitted and you could probably call over there to expedite setting up a time that works with your schedule.  Their number is [phone].  We have scheduled you for a CT scan of the abdomen and pelvis to get a good view of your ureter.  They probably will need to give you some kind of a contrast to better delineate the ureter, but I ordered it in a fashion that will allow them to decide what is best to get the best  views.  In the event of acute worsening return sooner.  Otherwise I would expect that the urologist will probably take over from here on decisions regarding what is causing the hydronephrosis.   If you have lab work done today you will be contacted with your lab results within the next 2 weeks.  If you have not heard from [place] then please contact us. The fastest way to get your results is to register for My Chart.   IF you received an x-ray today, you will receive an invoice from [facility]. Please contact [facility] at [phone] with questions or concerns regarding your invoice.   IF you received labwork today, you will receive an invoice from [facility]. Please contact [facility] at [phone] with questions or concerns regarding your invoice.   Our billing  staff will not be able to assist you with questions regarding bills from these companies.  You will be contacted with the lab results as soon as they are available. The fastest way to get your results is to activate your My Chart account. Instructions are located on the last page of this paperwork. If you have not heard from [place] regarding the results in 2 weeks, please contact this office.        Return if symptoms worsen or fail to improve.   Ruben Reason, MD 05/14/2020

## 2020-05-27 ENCOUNTER — Telehealth: Payer: Self-pay | Admitting: Family Medicine

## 2020-05-27 NOTE — Telephone Encounter (Signed)
[facility] called and is needing notes, labs and imaging sent to for her referral that is tomorrow 05/28/20. They are needing these asap. Please advise.

## 2020-05-27 NOTE — Telephone Encounter (Signed)
Paperwork has been faxed to [facility]

## 2020-05-31 ENCOUNTER — Other Ambulatory Visit: Payer: Self-pay | Admitting: Urology

## 2020-05-31 NOTE — Progress Notes (Signed)
Called patient for pre op phone call and she reports that she wants to reschedule her lithotripsy for Thursday due to traveling next week.  Called Pam at [employer] and left a VM about patients phone conversation.

## 2020-06-14 NOTE — Progress Notes (Signed)
Patient was working, unable to talk on phone. Instructed to stop ASA and omega 3. Will call back for pre-procedure instructions.

## 2020-06-15 ENCOUNTER — Other Ambulatory Visit: Payer: Self-pay | Admitting: Urology

## 2020-06-15 NOTE — Progress Notes (Signed)
Patient to arrive at 1200 on 06/17/2020. History and medications reviewed. Pre-procedure instructions given. NPO after MN, clear liquids only until 7AM. Prozac with sip of water in AM. Driver secured.

## 2020-06-17 ENCOUNTER — Ambulatory Visit (HOSPITAL_COMMUNITY): Payer: Medicare Other

## 2020-06-17 ENCOUNTER — Ambulatory Visit (HOSPITAL_BASED_OUTPATIENT_CLINIC_OR_DEPARTMENT_OTHER)
Admission: RE | Admit: 2020-06-17 | Discharge: 2020-06-17 | Disposition: A | Discharge status: Home / Self Care | Payer: Medicare Other | Attending: Urology | Admitting: Urology

## 2020-06-17 ENCOUNTER — Encounter (HOSPITAL_BASED_OUTPATIENT_CLINIC_OR_DEPARTMENT_OTHER): Payer: Self-pay | Admitting: Urology

## 2020-06-17 ENCOUNTER — Encounter (HOSPITAL_BASED_OUTPATIENT_CLINIC_OR_DEPARTMENT_OTHER)
Admission: RE | Disposition: A | Discharge status: Home / Self Care | Payer: Self-pay | Source: Home / Self Care | Attending: Urology

## 2020-06-17 DIAGNOSIS — Z91013 Allergy to seafood: Secondary | ICD-10-CM | POA: Diagnosis not present

## 2020-06-17 DIAGNOSIS — Z8051 Family history of malignant neoplasm of kidney: Secondary | ICD-10-CM | POA: Insufficient documentation

## 2020-06-17 DIAGNOSIS — Z7982 Long term (current) use of aspirin: Secondary | ICD-10-CM | POA: Diagnosis not present

## 2020-06-17 DIAGNOSIS — N201 Calculus of ureter: Secondary | ICD-10-CM | POA: Diagnosis not present

## 2020-06-17 DIAGNOSIS — Z01818 Encounter for other preprocedural examination: Secondary | ICD-10-CM | POA: Diagnosis not present

## 2020-06-17 DIAGNOSIS — Z90710 Acquired absence of both cervix and uterus: Secondary | ICD-10-CM | POA: Insufficient documentation

## 2020-06-17 DIAGNOSIS — Z79899 Other long term (current) drug therapy: Secondary | ICD-10-CM | POA: Insufficient documentation

## 2020-06-17 DIAGNOSIS — N132 Hydronephrosis with renal and ureteral calculous obstruction: Secondary | ICD-10-CM | POA: Diagnosis present

## 2020-06-17 DIAGNOSIS — Z820 Family history of epilepsy and other diseases of the nervous system: Secondary | ICD-10-CM | POA: Insufficient documentation

## 2020-06-17 DIAGNOSIS — Z888 Allergy status to other drugs, medicaments and biological substances status: Secondary | ICD-10-CM | POA: Insufficient documentation

## 2020-06-17 HISTORY — PX: EXTRACORPOREAL SHOCK WAVE LITHOTRIPSY: SHX1557

## 2020-06-17 SURGERY — LITHOTRIPSY, ESWL
Anesthesia: LOCAL | Laterality: Left

## 2020-06-17 MED ORDER — OXYCODONE-ACETAMINOPHEN 5-325 MG PO TABS: 1.0000 | ORAL_TABLET | ORAL | 0 refills | Status: AC | PRN

## 2020-06-17 MED ORDER — DIPHENHYDRAMINE HCL 25 MG PO CAPS
25.0000 mg | ORAL_CAPSULE | ORAL | Status: AC
  Administered 2020-06-17: 25 mg via ORAL

## 2020-06-17 MED ORDER — CIPROFLOXACIN HCL 500 MG PO TABS
500.0000 mg | ORAL_TABLET | ORAL | Status: AC
  Administered 2020-06-17: 500 mg via ORAL

## 2020-06-17 MED ORDER — TAMSULOSIN HCL 0.4 MG PO CAPS: 0.4000 mg | ORAL_CAPSULE | Freq: Every day | ORAL | 0 refills | Status: AC

## 2020-06-17 MED ORDER — DIAZEPAM 5 MG PO TABS
ORAL_TABLET | ORAL | Status: AC
  Filled 2020-06-17: qty 2

## 2020-06-17 MED ORDER — CIPROFLOXACIN HCL 500 MG PO TABS
ORAL_TABLET | ORAL | Status: AC
  Filled 2020-06-17: qty 1

## 2020-06-17 MED ORDER — SODIUM CHLORIDE 0.9 % IV SOLN: INTRAVENOUS | Status: DC

## 2020-06-17 MED ORDER — DIAZEPAM 5 MG PO TABS
10.0000 mg | ORAL_TABLET | ORAL | Status: AC
  Administered 2020-06-17: 10 mg via ORAL

## 2020-06-17 MED ORDER — DIPHENHYDRAMINE HCL 25 MG PO CAPS
ORAL_CAPSULE | ORAL | Status: AC
  Filled 2020-06-17: qty 1

## 2020-06-17 NOTE — Brief Op Note (Signed)
06/17/2020  3:13 PM  PATIENT:  Kelli Mclean  64 y.o. female  PRE-OPERATIVE DIAGNOSIS:  LEFT URETERAL STONE  POST-OPERATIVE DIAGNOSIS:  * No post-op diagnosis entered *  PROCEDURE:  Procedure(s): LEFT EXTRACORPOREAL SHOCK WAVE LITHOTRIPSY (ESWL) (Left) 7 mm Left distal stone  SURGEON:  Surgeon(s) and Role:    * Remi Haggard, MD - Primary  PHYSICIAN ASSISTANT:   ASSISTANTS: none   ANESTHESIA:   IV sedation  EBL:  minimal   BLOOD ADMINISTERED:none  DRAINS: none   LOCAL MEDICATIONS USED:  NONE  SPECIMEN:  No Specimen  DISPOSITION OF SPECIMEN:  N/A  COUNTS:  YES  TOURNIQUET:  * No tourniquets in log *  DICTATION: .Note written in EPIC  PLAN OF CARE: Discharge to home after PACU  PATIENT DISPOSITION:  PACU - hemodynamically stable.   Delay start of Pharmacological VTE agent (>24hrs) due to surgical blood loss or risk of bleeding: no

## 2020-06-18 ENCOUNTER — Encounter (HOSPITAL_BASED_OUTPATIENT_CLINIC_OR_DEPARTMENT_OTHER): Payer: Self-pay | Admitting: Urology

## 2020-06-21 NOTE — H&P (Signed)
CC: Left hydronephrosis, flank pain, microscopic hematuria  HPI:  05/28/2020  64 year old female underwent renal ultrasound on 05/10/2020 for left-sided flank pain and hematuria. This revealed left-sided hydronephrosis. No obvious obstructing stone seen on ultrasound. She has not had a CT scan. She has persistent microscopic hematuria today. Denies any dysuria. Pain is currently controlled. Pain has migrated down closer to her groin and she is having some mild irritative voiding complaints. She denies any fever, nausea, vomiting. CT scan today confirms a mid ureteral stone.     ALLERGIES: None   MEDICATIONS: Aspirin Ec 81 mg tablet, delayed release  Biotin 1 mg tablet  Fluoxetine Hcl 40 mg capsule  Glucosamine Chondroitin  Rosuvastatin Calcium 20 mg tablet     GU PSH: Hysterectomy     NON-GU PSH: None   GU PMH: Uterine Cancer, part Unspec    NON-GU PMH: None   FAMILY HISTORY: Death of family member - Father Dementia - Father Kidney Cancer - Brother    Notes: 2 sons; 1 daughter   SOCIAL HISTORY: Marital Status: Married Preferred Language: English; Ethnicity: Not Hispanic Or Latino; Race: White Current Smoking Status: Patient has never smoked.   Tobacco Use Assessment Completed: Used Tobacco in last 30 days? Does not use smokeless tobacco. Has never drank.  Drinks 2 caffeinated drinks per day. Patient's occupation Nurse, learning disability.    REVIEW OF SYSTEMS:    GU Review Female:   Patient denies frequent urination, hard to postpone urination, burning /pain with urination, get up at night to urinate, leakage of urine, stream starts and stops, trouble starting your stream, have to strain to urinate, and being pregnant.  Gastrointestinal (Upper):   Patient denies nausea, vomiting, and indigestion/ heartburn.  Gastrointestinal (Lower):   Patient denies diarrhea and constipation.  Constitutional:   Patient reports fatigue. Patient denies fever, night sweats, and weight loss.  Skin:    Patient denies skin rash/ lesion and itching.  Eyes:   Patient denies double vision and blurred vision.  Ears/ Nose/ Throat:   Patient denies sore throat and sinus problems.  Hematologic/Lymphatic:   Patient denies swollen glands and easy bruising.  Cardiovascular:   Patient denies leg swelling and chest pains.  Respiratory:   Patient denies cough and shortness of breath.  Endocrine:   Patient denies excessive thirst.  Musculoskeletal:   Patient reports back pain. Patient denies joint pain.  Neurological:   Patient denies headaches and dizziness.  Psychologic:   Patient denies depression and anxiety.   VITAL SIGNS:      05/28/2020 11:21 AM  Weight 130 lb / 58.97 kg  Height 62 in / 157.48 cm  BP 152/80 mmHg  Heart Rate 79 /min  Temperature 96.9 F / 36.0 C  BMI 23.8 kg/m   MULTI-SYSTEM PHYSICAL EXAMINATION:    Constitutional: Well-nourished. No physical deformities. Normally developed. Good grooming.  Respiratory: No labored breathing, no use of accessory muscles.   Cardiovascular: Normal temperature, Adequate perfusion of extremities  Skin: No paleness, no jaundice  Neurologic / Psychiatric: Oriented to time, oriented to place, oriented to person. No depression, no anxiety, no agitation.  Gastrointestinal: No mass, no tenderness, no rigidity, non obese abdomen. No CVA tenderness  Eyes: Normal conjunctivae. Normal eyelids.  Musculoskeletal: Normal gait and station of head and neck.     Complexity of Data:  Records Review:   Previous Doctor Records, Previous Patient Records  Urine Test Review:   Urinalysis  X-Ray Review: Renal Ultrasound: Reviewed Films. Reviewed Report. Discussed With Patient.  PROCEDURES:         C.T. Urogram - 74176  7 mm mid ureteral calculus on the left. Visible on scout imaging. Upstream hydronephrosis.      Patient confirmed No Neulasta OnPro Device.         Urinalysis w/Scope Dipstick Dipstick Cont'd Micro  Color: Yellow Bilirubin: Neg mg/dL  WBC/hpf: 6 - 10/hpf  Appearance: Clear Ketones: Neg mg/dL RBC/hpf: 20 - 40/hpf  Specific Gravity: 1.020 Blood: 3+ ery/uL Bacteria: Rare (0-9/hpf)  pH: 7.5 Protein: Trace mg/dL Cystals: NS (Not Seen)  Glucose: Neg mg/dL Urobilinogen: 0.2 mg/dL Casts: NS (Not Seen)    Nitrites: Neg Trichomonas: Not Present    Leukocyte Esterase: 1+ leu/uL Mucous: Present      Epithelial Cells: 0 - 5/hpf      Yeast: NS (Not Seen)      Sperm: Not Present    ASSESSMENT:      ICD-10 Details  1 GU:   Microscopic hematuria - R31.21 Undiagnosed New Problem  2   Flank Pain - R10.84 Undiagnosed New Problem  3   Ureteral calculus - N20.1 Undiagnosed New Problem  4   Ureteral obstruction secondary to calculous - N13.2 Undiagnosed New Problem   PLAN:           Orders Labs Urine Culture  X-Rays: C.T. Stone Protocol Without Contrast  X-Ray Notes: History:   Hematuria: Yes/No  Patient to see MD after exam: Yes/No  Previous exam: CT / IVP/ US/ KUB/ None  When:  Where:  Diabetic: Yes/ No  High Blood Pressure: Yes/ No  BUN/ Creatine:  Date of last BUN Creatinine:  Weight in pounds:  Allergy- Contrasts/ Shellfish: Yes/ No  Conflicting diabetic meds: Yes/ No  Oral contrast and instructions given to patient:   Prior Authorization #: no auth required             Schedule         Document Letter(s):  Created for Patient: Clinical Summary         Notes:   We discussed the management of urinary stones. These options include observation, ureteroscopy, and shockwave lithotripsy. We discussed which options are relevant to these particular stones. We discussed the natural history of stones as well as the complications of untreated stones and the impact on quality of life without treatment as well as with each of the above listed treatments. We also discussed the efficacy of each treatment in its ability to clear the stone burden. With any of these management options I discussed the signs and symptoms  of infection and the need for emergent treatment should these be experienced. For each option we discussed the ability of each procedure to clear the patient of their stone burden.   For observation I described the risks which include but are not limited to silent renal damage, life-threatening infection, need for emergent surgery, failure to pass stone, and pain.   For ureteroscopy I described the risks which include heart attack, stroke, pulmonary embolus, death, bleeding, infection, damage to contiguous structures, positioning injury, ureteral stricture, ureteral avulsion, ureteral injury, need for ureteral stent, inability to perform ureteroscopy, need for an interval procedure, inability to clear stone burden, stent discomfort and pain.   For shockwave lithotripsy I described the risks which include arrhythmia, kidney contusion, kidney hemorrhage, need for transfusion, pain, inability to break up stone, inability to pass stone fragments, Steinstrasse, infection associated with obstructing stones, need for different surgical procedure, need for repeat shockwave lithotripsy.  she would like to proceed with [facility].

## 2022-05-15 IMAGING — US US RENAL
2 series · 14 of 25 positions shown · non-contrast
Comparison: None.

CLINICAL DATA: Left-sided flank pain and hematuria

EXAM:
RENAL / URINARY TRACT ULTRASOUND COMPLETE

[Series 1: us renal · 0.22mm/px · 13 of 43 slices shown (1 of 2)]
[im 1/43]
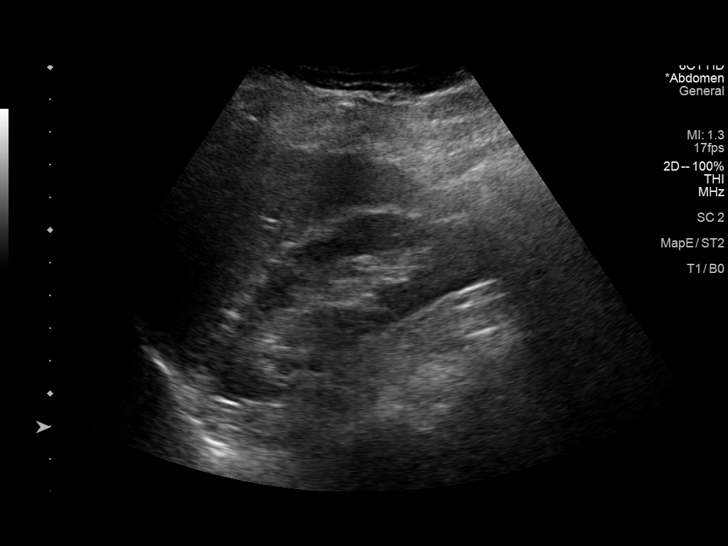
[im 4/43]
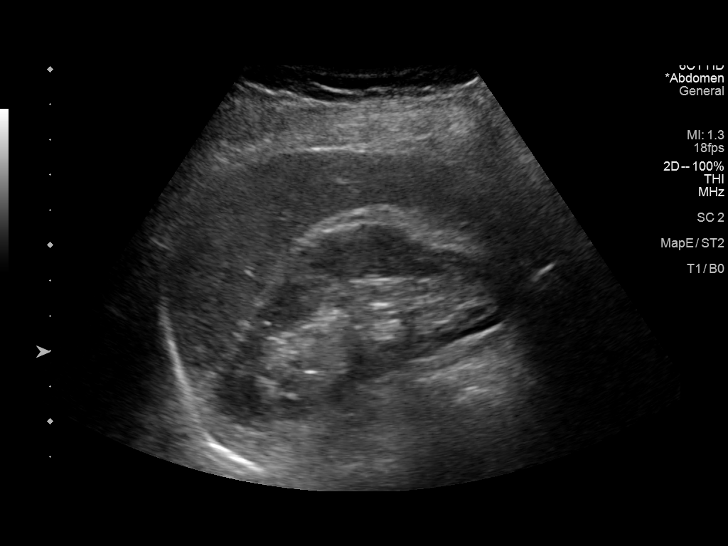
[im 8/43]
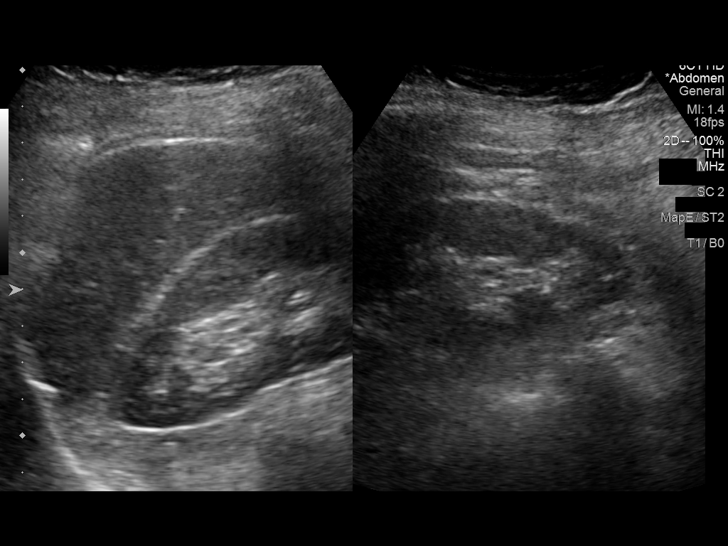
[im 12/43]
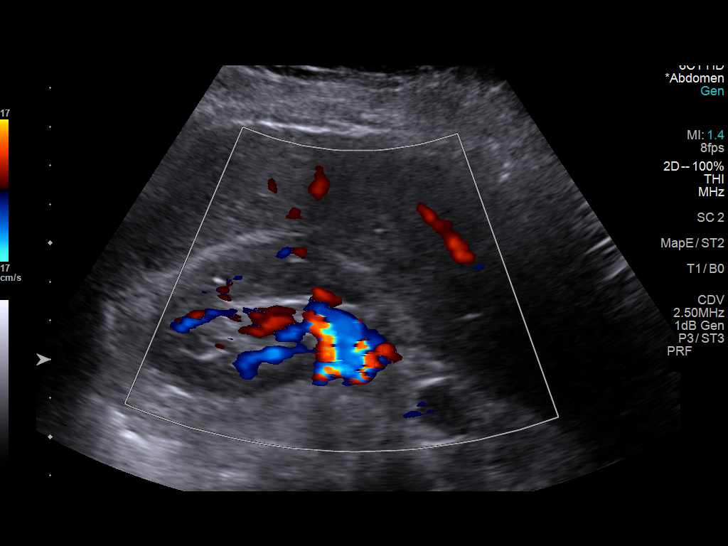
[im 16/43]
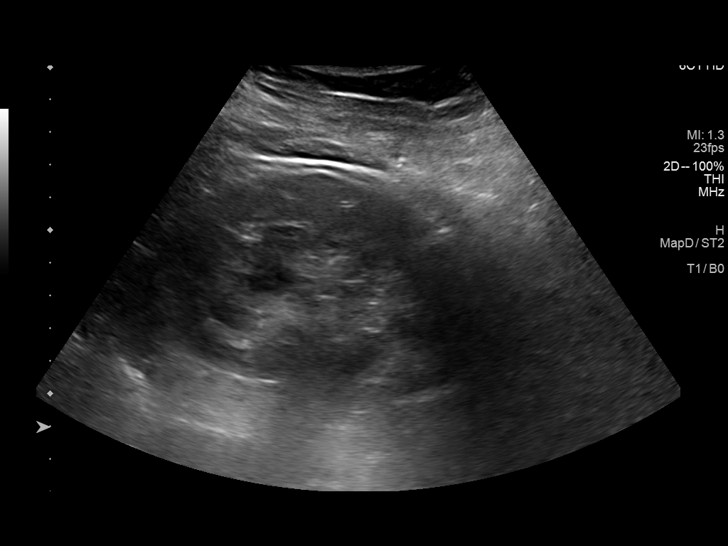
[im 18/43]
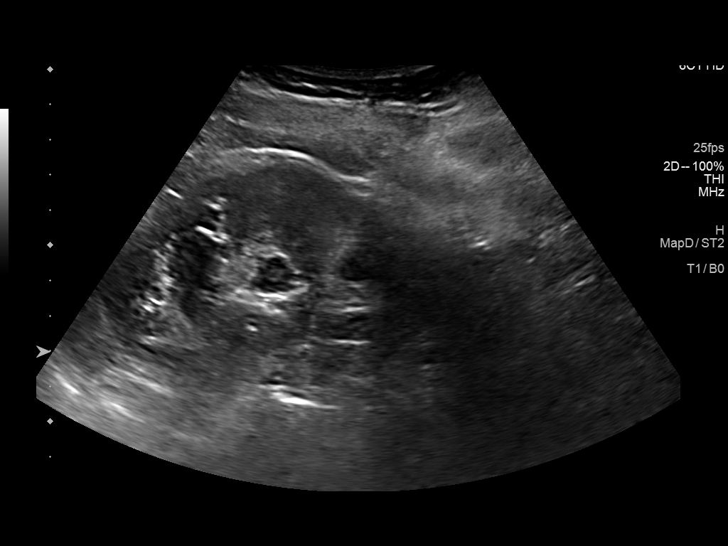
[im 22/43]
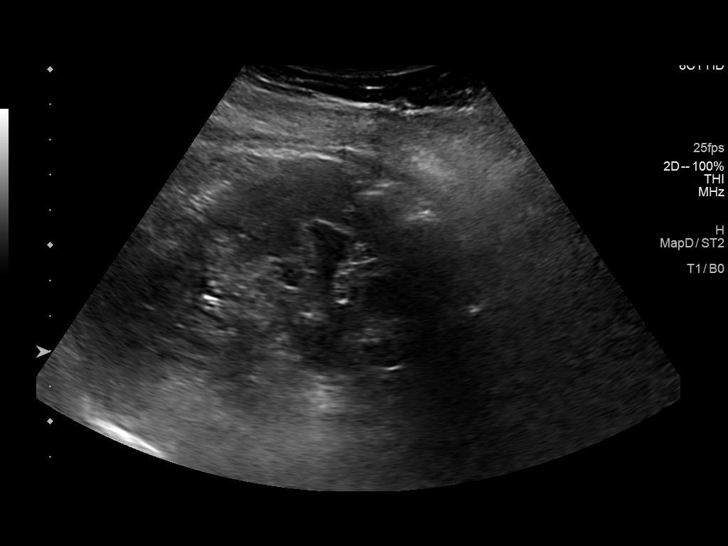
[im 25/43]
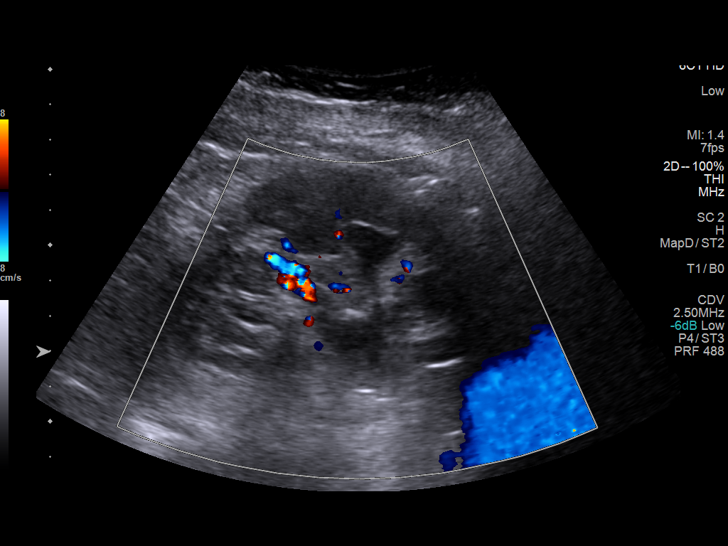
[im 29/43]
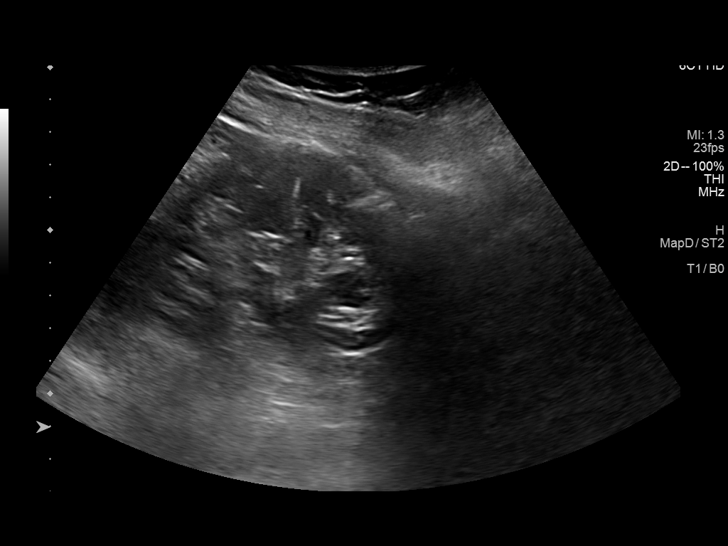
[im 31/43]
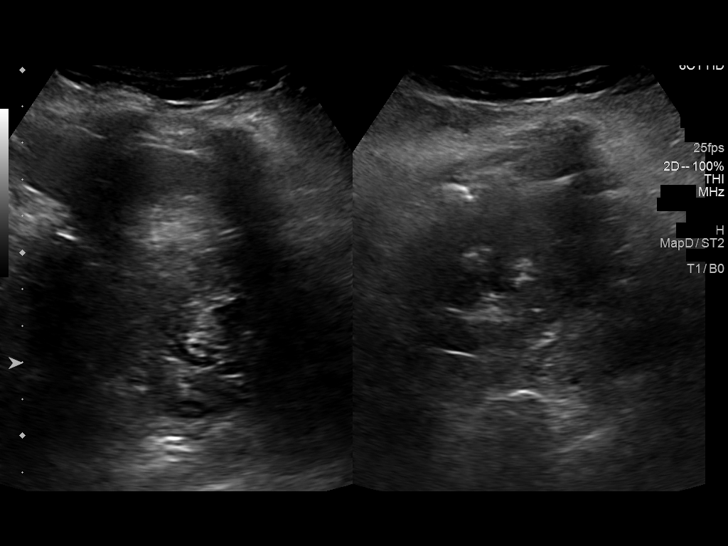
[im 35/43]
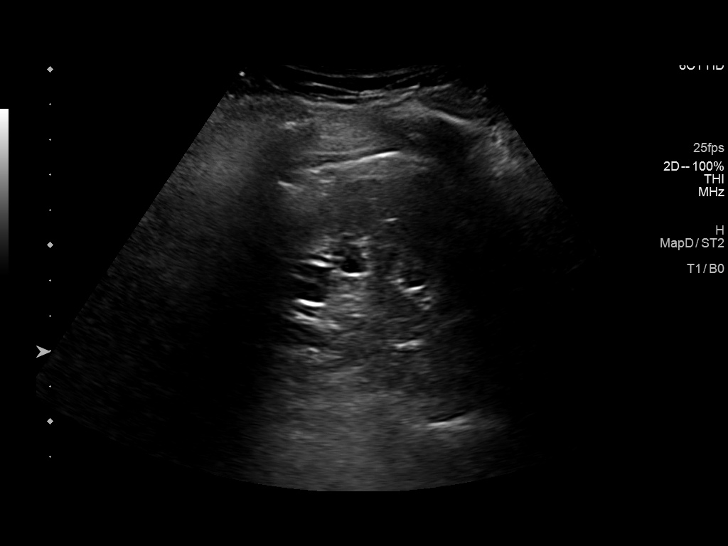
[im 39/43]
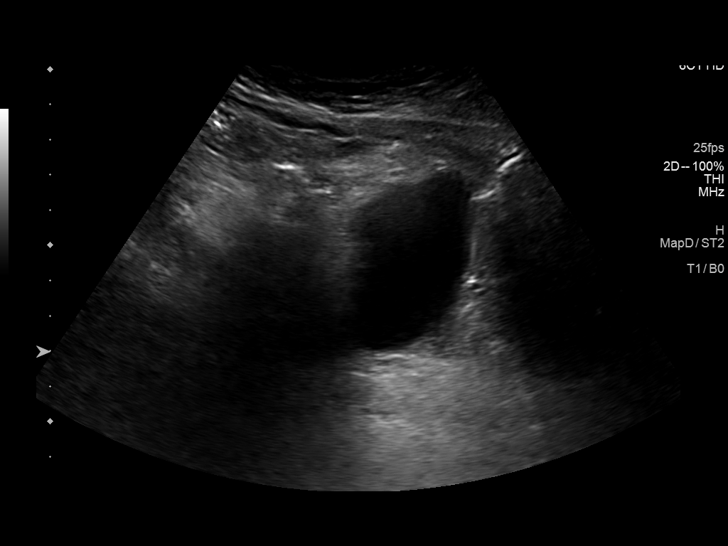
[im 43/43]
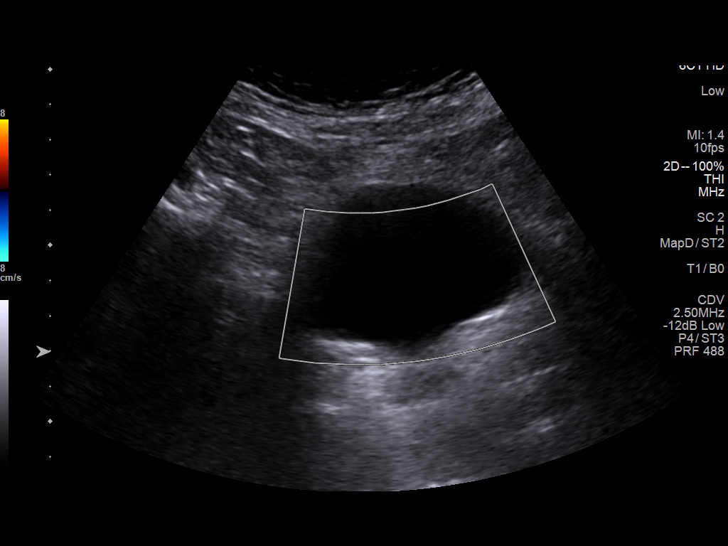

[Series 4: us renal · 0.18mm/px · 1 of 4 slices shown (2 of 2)]
[im 4/4]
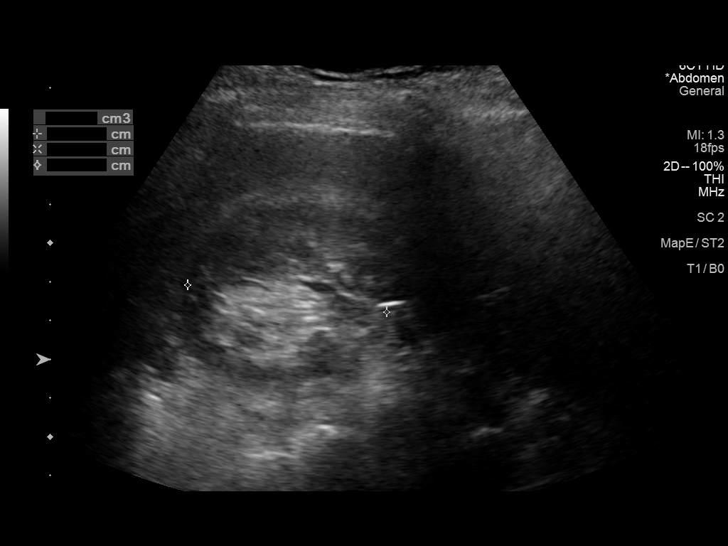

[14 of 25 positions shown; findings below may reference images not displayed]

FINDINGS: Right Kidney:

Renal measurements: 9.8 x 3.5 x 5.2 cm. = volume: 92 mL .
Echogenicity within normal limits. No mass or hydronephrosis
visualized.

Left Kidney:

Renal measurements: 10.2 x 5.3 x 5.9 cm. = volume: 165 mL. Moderate
hydronephrosis is noted. No definitive obstructing stone or lesion
is noted.

Bladder:

Partially distended.

Other:

None.
IMPRESSION: Left-sided hydronephrosis without definitive obstructing lesion
identified. CT may be helpful for further evaluation.

## 2022-06-22 IMAGING — DX DG ABDOMEN 1V
1 series · 1 of 1 positions shown · non-contrast
Comparison: 05/28/2020 CT abdomen/pelvis

CLINICAL DATA: Preoperative for left-sided stone

EXAM:
ABDOMEN - 1 VIEW

[abdomen kub]
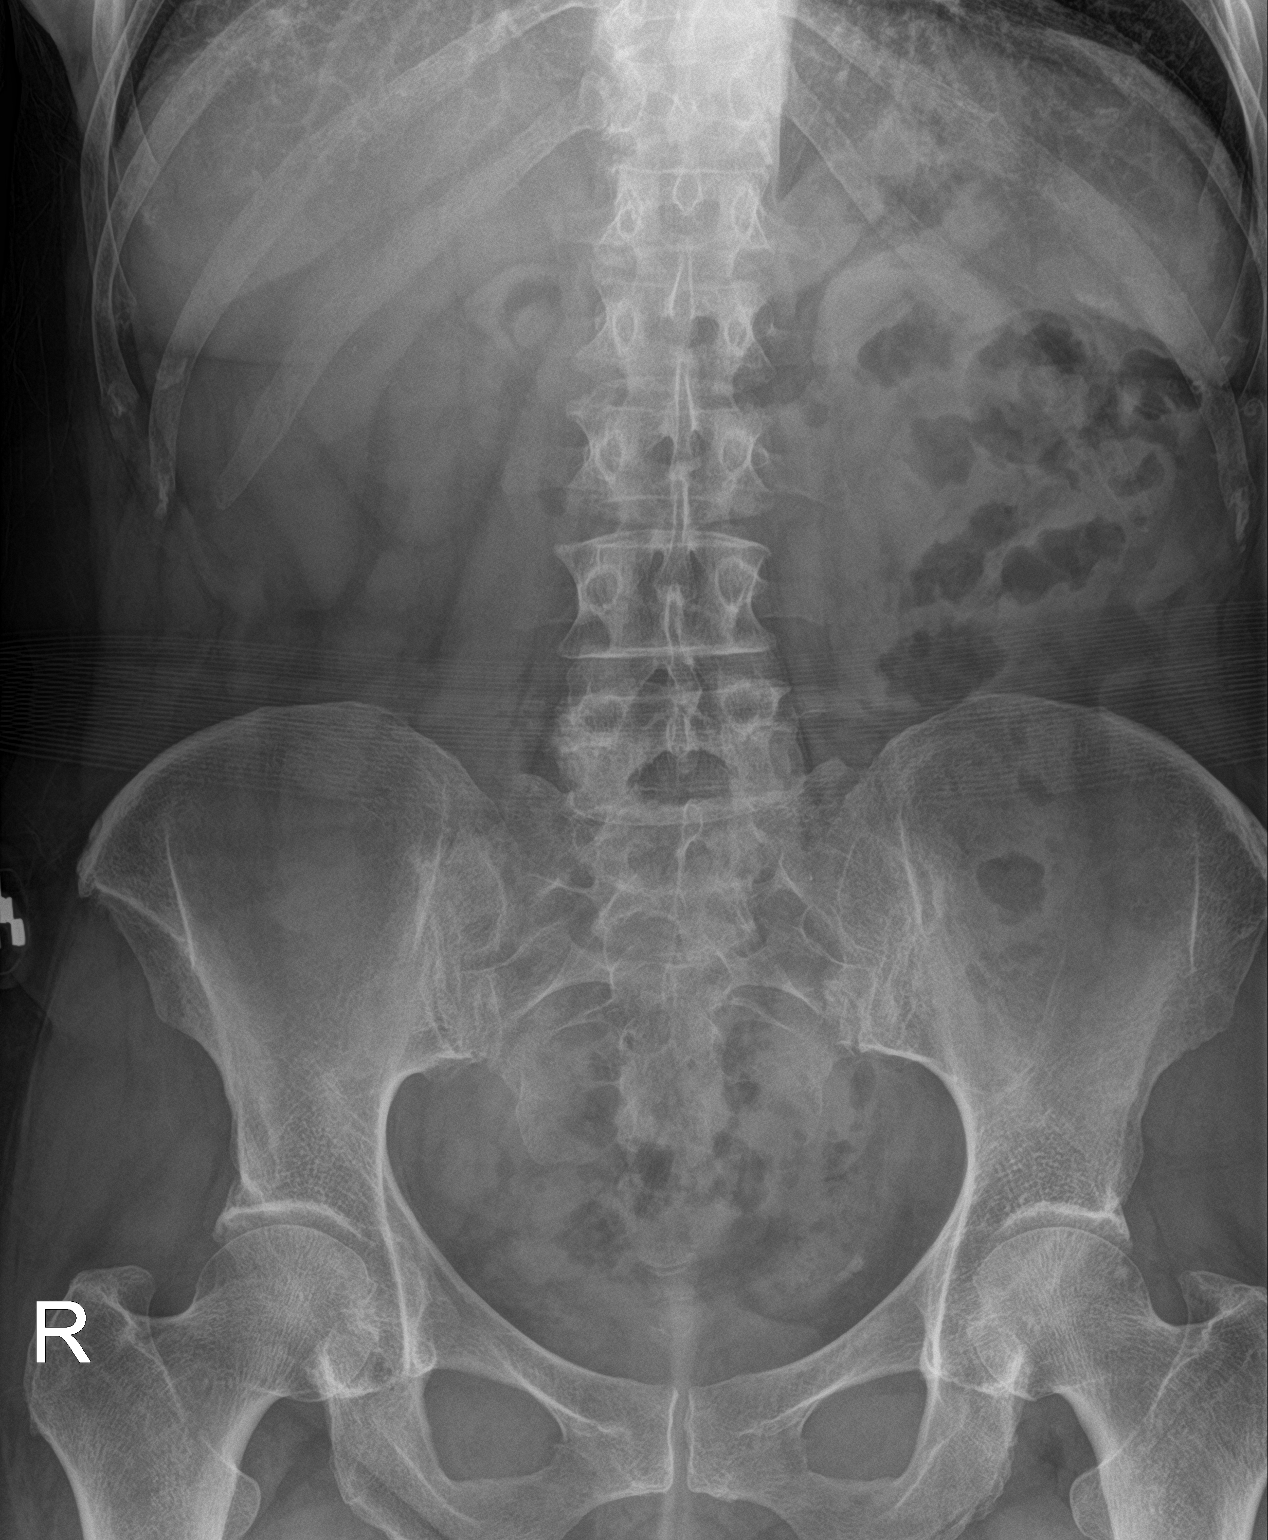

[1 of 1 positions shown; findings below may reference images not displayed]

FINDINGS: There is a 6 mm calcification in the left deep pelvis, suspected to
represent antegrade progression of the previously visualized left
mid ureteral stone into the distal left ureter. No additional
radiopaque stones overlying the kidneys, ureters or bladder. No
dilated small bowel loops. No evidence of pneumatosis or
pneumoperitoneum.
IMPRESSION: Distal left ureteral 6 mm stone.

## 2022-08-09 ENCOUNTER — Ambulatory Visit (HOSPITAL_COMMUNITY)
Admission: EM | Admit: 2022-08-09 | Discharge: 2022-08-09 | Disposition: A | Discharge status: Home / Self Care | Payer: Medicare Other | Attending: Psychiatry | Admitting: Psychiatry

## 2022-08-09 DIAGNOSIS — F4323 Adjustment disorder with mixed anxiety and depressed mood: Secondary | ICD-10-CM | POA: Diagnosis present

## 2022-08-09 MED ORDER — MIRTAZAPINE 7.5 MG PO TABS: 7.5000 mg | ORAL_TABLET | Freq: Every day | ORAL | 0 refills | Status: DC

## 2022-08-09 MED ORDER — REMERON 15 MG PO TABS: 7.5000 mg | ORAL_TABLET | Freq: Every day | ORAL | 0 refills | Status: DC
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
## 2022-08-09 NOTE — BH Assessment (Signed)
LCSW Progress Note  Per Aura Fey, NP, this pt does not require psychiatric hospitalization at this time.  Pt is psychiatrically cleared.  Discharge instructions include several resources for outpatient therapy and medication management.  EDP Aura Fey, NP, has been notified.  Omelia Blackwater, MSW, [place] [phone] or [phone]
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
## 2022-08-09 NOTE — Discharge Instructions (Addendum)
Good afternoon, Ms. Sheer!  Base on the information you have provided and the presenting issue, outpatient services with therapy and psychiatry have been recommended.  It is imperative that you follow through with treatment recommendations within 5-7 days from the of discharge to mitigate further risk to your safety and mental well-being. A list of referrals has been provided below to get you started.  The list consists of providers accepting Medicare and/or BCBS. You are not limited to the list provided.  In case of an urgent crisis, you may contact the Mobile Crisis Unit with [facility] at [phone].  Outpatient Therapy and Psychiatry for Medicare Recipients  [place] [address] [place], [place], [address] [phone] phone  [place] [address] [place], [place], [address] [phone] phone ([address] [place], [place], [place], [place], Friday Health Plans, [place], [place], [place], [employer] [place], [place], [place], [place], [place], [place], [employer], [place])  [facility] [address], [place], [place], [address] [phone] phone  [facility] [address], [place], [place], [address] [phone] phone  [place] [address] [place], [place], [address] [phone] phone [phone] fax  [facility], [place] [address], [place], [address] [phone] phone  [facility] [place] [place], [place], [address] [phone] phone [phone] fax  [place] [address] [place], [place], [address] [phone] phone  [facility] [address] [place], [place], [address] [phone] phone  [facility] [employer]. [place], [place], [address] [phone]  phone [phone] fax   It is imperative that you follow through with treatment recommendations within 5-7 days from the day of discharge to mitigate further risk to your safety and overall mental well-being.  A list of outpatient therapy and psychiatric providers for medication management has been provided below to get you started in finding the right provider for you.            [facility] [address] [place], [place], [address] [phone] phone (Medicare, Private insurance except [place], [place], and [facility])  [place] [address] [place], [place], [address] [phone] phone ([address], [employer] [facility], BCBS, [place], [employer], [employer] Plans, [facility], BCBS Healthy Blue, Humana, [employer]ellan Health, [place], [place], [place], [place], Tricare, [facility], [facility] Community Plan, [facility])  [facility] [address] [place], [place], [address] [phone] phone (Call to confirm insurance coverage) Consultation & Support Services     o Drop-In Hours: 1:00 PM to 5:00 PM     o Days: Monday - Thursday  Crisis Services (24/7)   [facility] [address] [place], [place], [address] [phone] phone ([address] [place], [place], [place] Medicaid, [place] and [place], [facility])      [place] [address], [place], [place], [address] [phone] phone [email or web address]  (to complete the intake form and upload ID and insurance cards)  [place] [place], [place], [address] [phone] phone ([place], [place], [place], [place], [place], [place], [facility], [place], and certain Medicaid plans)  [place], [place], [place], [address] [phone] phone [phone] fax (Medicaid,  Medicare, Self-pay, call about other insurance coverage)  [facility] (age 77+) [address], [place], [place], [address] [phone] hone [phone] fax ([place], MedCost, [place], [place], [place], [place], [place], [place], [place], certain [employer], [facility], [place])  [facility] [place], [place], [address] [phone] phone (Medicare, Medicaid, [place]ah Dinning, call about other insurance coverage)  [place] [address] [place] [address] [place], [place], [address] [phone] phone [phone] fax (Call [phone] to see what insurance is accepted) Norma Fredrickson, MD specializes in geropsych)  [facility]  (medication management only) [address] [place], [place], [address] [phone] phone [phone] fax ([address], Medicaid, [place], [place], [place], [place], [place], [place], [place])  [facility] (medication management only) [address], [address] [place], [place], [address] [phone] phone [phone] fax ([address], Medicare, [place], [place], [place])  [facility] [address] [place], [place], [address] [phone] phone [phone] fax ([address], [place], [place], [place], [place], [facility], [place])  [facility] [place] [place], [place], [address] [phone] phone [phone] fax (Medicare, Medicaid, [facility])  [place], [place] [address] [phone] phone ([address], [place], [place], [place], [place], [place], [place], [facility]) Does genetic testing for medications; does transcranial magnetic stimulation along with basic services)  [facility] [address] [place], [place], [address] [phone] phone (Call about insurance coverage)  [facility] [place] [place], [place], [address] [phone] phone [phone] fax (Call about insurance coverage)  [place] [address] [place], [place], [address] [phone] phone [phone] fax (Call about insurance coverage)  [employer] [phone] [address], [place], [address] [phone] phone (Medicaid, [place], [place], [place], [place])  [facility] [address] [place], [place], [address] [phone] phone (Medicaid, Medicare, call about other insurance coverage)  [facility] [address] [place], [place], [address] [phone] phone [phone] fax (Medicaid, Medicare, Tricare, call about other insurance coverage)  [facility] [place] [place], [place], [address] [phone] phone ([address], [place], [place], [place], [place], [place] types - Alliance, Stage manager, Partners, [place], [place] Choice, Healthy [place], [place], [place], and Complete)  [place] [address], [place], [place], [address] [phone] phone Completely online treatment platform Contact: [facility] Specialist [phone] phone [phone] fax ([address], [place], [place], Friday Health Plan, Humana, [place], [place], [place], [place], [place])

## 2022-08-18 ENCOUNTER — Ambulatory Visit (INDEPENDENT_AMBULATORY_CARE_PROVIDER_SITE_OTHER): Payer: Medicare Other | Admitting: Family

## 2022-08-18 ENCOUNTER — Encounter: Payer: Self-pay | Admitting: Family

## 2022-08-18 VITALS — BP 124/90 | HR 76 | Temp 98.7°F | Ht 62.0 in | Wt 116.5 lb

## 2022-08-18 DIAGNOSIS — F411 Generalized anxiety disorder: Secondary | ICD-10-CM | POA: Diagnosis not present

## 2022-08-18 DIAGNOSIS — E7849 Other hyperlipidemia: Secondary | ICD-10-CM | POA: Diagnosis not present

## 2022-08-18 DIAGNOSIS — M545 Low back pain, unspecified: Secondary | ICD-10-CM

## 2022-08-18 MED ORDER — MIRTAZAPINE 7.5 MG PO TABS: 7.5000 mg | ORAL_TABLET | Freq: Every day | ORAL | 2 refills | Status: DC

## 2022-08-18 MED ORDER — ESCITALOPRAM OXALATE 5 MG PO TABS: 5.0000 mg | ORAL_TABLET | Freq: Every day | ORAL | 2 refills | Status: DC

## 2022-08-18 NOTE — Progress Notes (Signed)
New Patient Office Visit  Subjective:  Patient ID: Kelli Mclean, female    DOB: Jun 14, 1956  Age: 66 y.o. MRN: [id number]  CC:  Chief Complaint  Patient presents with  . Establish Care  . Back Pain    Pt c/o right lower back pain, dull and achy. Present for a while off and on. Mostly early in the mornings .   Marland Kitchen Anxiety    Pt c/o Anxiety due to some issues she has had in her life.     HPI Kelli Mclean presents for establishing care today.  Anxiety:  seen by [facility] recently started on Remeron 7.'5mg'$  qhs used to take Prozac a couple of years ago, titrated up to '40mg'$ .  She is concerned if this is safe with Remeron or if she still needs. She is sleeping ok, still wakes around 3:30-4am some nights, but appetite is a little better. Still reports anxiety during the day, pt tearful in the office discussing family problems, recent house disaster and having to move out. Hyperlipidemia: Patient is currently maintained on the following medication for hyperlipidemia: none - used to take Crestor. Stopped as she heard taking statins long time is not good. Patient reports good compliance with low fat/low cholesterol diet.  Last lipid panel as follows: Lab Results  Component Value Date   CHOL 247 (H) 04/30/2020   HDL 67 04/30/2020   LDLCALC 165 (H) 04/30/2020   TRIG 88 04/30/2020   CHOLHDL 3.7 04/30/2020   Lumbar pain:  right side, hx of kidney stones.    Assessment & Plan:   Problem List Items Addressed This Visit   None   Subjective:    Outpatient Medications Prior to Visit  Medication Sig Dispense Refill  . Biotin 1 MG CAPS Take by mouth.    . hydrocortisone (ANUSOL-HC) 25 MG suppository Place 1 suppository (25 mg total) rectally 2 (two) times daily. (Patient taking differently: Place 25 mg rectally as needed.) 12 suppository 2  . Multiple Vitamins-Minerals (MULTIVITAMIN WITH MINERALS) tablet Take 1 tablet by mouth daily.    Marland Kitchen aspirin 81 MG tablet Take 81 mg by  mouth daily. (Patient not taking: Reported on 08/18/2022)    . FLUoxetine (PROZAC) 40 MG capsule Take 1 capsule (40 mg total) by mouth daily. (Patient not taking: Reported on 08/18/2022) 90 capsule 3  . GLUCOSAMINE CHONDROITIN COMPLX PO Take by mouth. Taking 2 daily (Patient not taking: Reported on 08/18/2022)    . mirtazapine (REMERON) 7.5 MG tablet Take 1 tablet (7.5 mg total) by mouth at bedtime for 14 days. (Patient not taking: Reported on 08/18/2022) 14 tablet 0  . Omega-3 1000 MG CAPS Take by mouth. (Patient not taking: Reported on 08/18/2022)    . rosuvastatin (CRESTOR) 20 MG tablet Take 1 tablet (20 mg total) by mouth daily. (Patient not taking: Reported on 08/18/2022) 90 tablet 3   No facility-administered medications prior to visit.   Past Medical History:  Diagnosis Date  . Anxiety   . Endometrial cancer, grade I ([place]) 12/04/2010   s/p hysterectomy/BSO/washings;  [place].  . Hyperlipidemia    Past Surgical History:  Procedure Laterality Date  . ABDOMINAL HYSTERECTOMY  12/04/2010   uterine cancer; B oophorectomy.  Marland Kitchen BREAST DUCTAL SYSTEM EXCISION Right 08/04/2015   Procedure: RIGHT NIPPLE DUCT EXCISION;  Surgeon: Donnie Mesa, MD;  Location: [facility];  Service: General;  Laterality: Right;  . BREAST EXCISIONAL BIOPSY Right 08/04/2015   duct excision  . EXTRACORPOREAL SHOCK  WAVE LITHOTRIPSY Left 06/17/2020   Procedure: LEFT EXTRACORPOREAL SHOCK WAVE LITHOTRIPSY (ESWL);  Surgeon: Remi Haggard, MD;  [facility];  Service: Urology;  Laterality: Left;  . FOOT SURGERY     RT  BONE SPUR    Objective:   Today's Vitals: BP (!) 124/90 (BP Location: Left Arm, Patient Position: Sitting, Cuff Size: Large)   Pulse 76   Temp 98.7 F (37.1 C) (Temporal)   Ht '5\' 2"'$  (1.575 m)   Wt 116 lb 8 oz (52.8 kg)   SpO2 99%   BMI 21.31 kg/m   Physical Exam  No orders of the defined types were placed in this encounter.   Kelli Sewer, NP

## 2022-08-18 NOTE — Patient Instructions (Signed)
Welcome to [facility], It was a pleasure meeting you today!   I will review your lab results via MyChart in a few days.  I have sent over a refill of the Remeron to take at bedtime, continue taking this. I also have sent generic Lexapro, start this tomorrow morning. Let me know via MyChart message or phone call how you are doing in 2 weeks.   I would try generic Aleve 1 tablet up to twice a day to help your low back pain. You can also try over the counter Lidocaine patches directly on your back to help with pain.  Please schedule a 2 month follow up visit today.    PLEASE NOTE: If you had any LAB tests please let us know if you have not heard back within a few days. You may see your results on MyChart before we have a chance to review them but we will give you a call once they are reviewed by [place]. If we ordered any REFERRALS today, please let us know if you have not heard from their office within the next week.  Let us know through MyChart if you are needing REFILLS, or have your pharmacy send [place] the request. You can also use MyChart to communicate with me or any office staff.   Please try these tips to maintain a healthy lifestyle:  Eat most of your calories during the day when you are active. Eliminate processed foods including packaged sweets (pies, cakes, cookies), reduce intake of potatoes, white bread, white pasta, and white rice. Look for whole grain options, oat flour or almond flour.  Each meal should contain half fruits/vegetables, one quarter protein, and one quarter carbs (no bigger than a computer mouse).  Cut down on sweet beverages. This includes juice, soda, and sweet tea. Also watch fruit intake, though this is a healthier sweet option, it still contains natural sugar! Limit to 3 servings daily.  Drink at least 1 8oz. glass of water with each meal and aim for at least 8 glasses per day  Exercise at least 150 minutes every week.

## 2022-08-19 LAB — COMPREHENSIVE METABOLIC PANEL
AG Ratio: 2.1 (calc) (ref 1.0–2.5)
ALT: 14 U/L (ref 6–29)
AST: 19 U/L (ref 10–35)
Albumin: 4.5 g/dL (ref 3.6–5.1)
Alkaline phosphatase (APISO): 81 U/L (ref 37–153)
BUN: 12 mg/dL (ref 7–25)
CO2: 26 mmol/L (ref 20–32)
Calcium: 9.5 mg/dL (ref 8.6–10.4)
Chloride: 106 mmol/L (ref 98–110)
Creat: 0.68 mg/dL (ref 0.50–1.05)
Globulin: 2.1 g/dL (calc) (ref 1.9–3.7)
Glucose, Bld: 95 mg/dL (ref 65–99)
Potassium: 4.3 mmol/L (ref 3.5–5.3)
Sodium: 144 mmol/L (ref 135–146)
Total Bilirubin: 0.9 mg/dL (ref 0.2–1.2)
Total Protein: 6.6 g/dL (ref 6.1–8.1)

## 2022-08-19 LAB — LIPID PANEL
Cholesterol: 294 mg/dL — ABNORMAL HIGH (ref <200)
HDL: 63 mg/dL (ref >=50)
LDL Cholesterol (Calc): 202 mg/dL (calc) — ABNORMAL HIGH
Non-HDL Cholesterol (Calc): 231 mg/dL (calc) — ABNORMAL HIGH (ref <130)
Total CHOL/HDL Ratio: 4.7 (calc) (ref <5.0)
Triglycerides: 134 mg/dL (ref <150)

## 2022-08-20 NOTE — Assessment & Plan Note (Addendum)
   chronic  used to take Prozac '40mg'$  qd 2y ago, but stopped after 10m52yr- had been doing well until recently & she reports a lot of crises coming up suddenly  discussed med options, agreed to start Lexapro '5mg'$  qd  advised pt to let me know in 2 weeks how she is doing.  f/u in 2 months

## 2022-08-20 NOTE — Assessment & Plan Note (Signed)
   chronic  used to be on Crestor, stopped due to worry re long term effects  checking labs today

## 2022-08-21 NOTE — Progress Notes (Signed)
Your glucose, electrolytes,  liver & kidney function are all normal.  Your total cholesterol number & LDL (bad #) are very high. I would recommend restarting the Crestor you were on before, but you can take every other day or just 3 days per week if you are concerned about side effects. Need to reduce any fried foods, alcohol, nonnutritional snacks e.g. chips/cookies,pies, cakes and candies, fatty meat (red meat), high fat dairy foods:  including cheese, milk, ice cream.  Increase fruits/vegetables/fiber.   Continue or restart an exercise routine, shooting for 62mn 5-7days per week.   Recommend repeating fasting lipids (no food or drink except black coffee or water after midnight) in 3-6 months with an office visit, can schedule today.  Let me know about the Crestor!

## 2022-08-22 ENCOUNTER — Other Ambulatory Visit: Payer: Self-pay

## 2022-08-22 ENCOUNTER — Other Ambulatory Visit: Payer: Self-pay | Admitting: Family

## 2022-08-22 DIAGNOSIS — E7849 Other hyperlipidemia: Secondary | ICD-10-CM

## 2022-08-22 MED ORDER — ROSUVASTATIN CALCIUM 20 MG PO TABS: 20.0000 mg | ORAL_TABLET | Freq: Every day | ORAL | 1 refills | Status: DC

## 2022-08-22 MED ORDER — ROSUVASTATIN CALCIUM 20 MG PO TABS: 20.0000 mg | ORAL_TABLET | ORAL | 1 refills | Status: DC

## 2022-08-22 NOTE — Progress Notes (Signed)
Thx!- I changed the Crestor RX to qod & 15 pills

## 2022-08-28 ENCOUNTER — Encounter: Payer: Self-pay | Admitting: Family

## 2022-08-28 ENCOUNTER — Encounter: Payer: Self-pay | Admitting: *Deleted

## 2022-09-25 ENCOUNTER — Telehealth: Payer: Self-pay | Admitting: Family

## 2022-09-25 ENCOUNTER — Other Ambulatory Visit: Payer: Self-pay

## 2022-09-25 DIAGNOSIS — F411 Generalized anxiety disorder: Secondary | ICD-10-CM

## 2022-09-25 MED ORDER — ESCITALOPRAM OXALATE 5 MG PO TABS: 5.0000 mg | ORAL_TABLET | Freq: Every day | ORAL | 1 refills | Status: DC

## 2022-09-25 MED ORDER — ESCITALOPRAM OXALATE 5 MG PO TABS: 5.0000 mg | ORAL_TABLET | Freq: Two times a day (BID) | ORAL | 1 refills | Status: DC

## 2022-09-25 NOTE — Telephone Encounter (Signed)
I called and spoke with pt, pt is taking this twice a day('10mg'$ ). RX sent.

## 2022-09-25 NOTE — Telephone Encounter (Signed)
Patient has medication question / quantity- re escitalopram (LEXAPRO) 5 MG tablet   Please call patient as per request.

## 2022-09-27 ENCOUNTER — Telehealth: Payer: Self-pay | Admitting: Family

## 2022-09-27 NOTE — Telephone Encounter (Signed)
Patient states Pharmacy will not fill RX for escitalopram (LEXAPRO) 5 MG tablet  as written until 10/01/22-Patient is out of medication.   Patient states Pharmacy needs a new RX written with PCP authorization. Patient requests to be called at ph# [phone].

## 2022-09-27 NOTE — Telephone Encounter (Signed)
Pt asked to speak with cma stating she had some questions.

## 2022-09-28 ENCOUNTER — Other Ambulatory Visit: Payer: Self-pay

## 2022-09-28 DIAGNOSIS — F411 Generalized anxiety disorder: Secondary | ICD-10-CM

## 2022-09-28 NOTE — Telephone Encounter (Signed)
I called and apoke with pt and pharmacy. Dose will stay at 5 mg but 60 tablets will be sent in. Pt states she take either 5 mg or 2 tablets which is 10 mg depending on how she feels.

## 2022-10-11 ENCOUNTER — Ambulatory Visit: Payer: Medicare Other | Admitting: Family

## 2022-10-18 ENCOUNTER — Encounter: Payer: Self-pay | Admitting: Family

## 2022-10-18 ENCOUNTER — Ambulatory Visit (INDEPENDENT_AMBULATORY_CARE_PROVIDER_SITE_OTHER): Payer: Medicare Other | Admitting: Family

## 2022-10-18 VITALS — BP 125/75 | HR 75 | Temp 98.2°F | Ht 62.0 in | Wt 123.2 lb

## 2022-10-18 DIAGNOSIS — F411 Generalized anxiety disorder: Secondary | ICD-10-CM

## 2022-10-18 DIAGNOSIS — Z23 Encounter for immunization: Secondary | ICD-10-CM

## 2022-10-18 DIAGNOSIS — E7849 Other hyperlipidemia: Secondary | ICD-10-CM

## 2022-10-18 MED ORDER — MIRTAZAPINE 7.5 MG PO TABS: 7.5000 mg | ORAL_TABLET | Freq: Every day | ORAL | 2 refills | Status: DC

## 2022-10-18 MED ORDER — ESCITALOPRAM OXALATE 5 MG PO TABS: 10.0000 mg | ORAL_TABLET | Freq: Every day | ORAL | 1 refills | Status: DC

## 2022-10-18 MED ORDER — ROSUVASTATIN CALCIUM 20 MG PO TABS: 20.0000 mg | ORAL_TABLET | ORAL | 1 refills | Status: DC

## 2022-10-18 NOTE — Progress Notes (Signed)
New Patient Office Visit  Subjective:  Patient ID: Kelli Mclean, female    DOB: 12-Feb-1956  Age: 66 y.o. MRN: [id number]  CC:  Chief Complaint  Patient presents with   Follow-up   Anxiety    Pt would like to discuss medication     HPI Kelli Mclean presents for establishing care today.  Anxiety:  seen by [facility] recently started on Remeron 7.'5mg'$  qhs used to take Prozac a couple of years ago, titrated up to '40mg'$  about 2 years ago.  She is sleeping better, still wakes around 3:30-4am some nights, but appetite is a little better.  Started Lexapro last visit, increased to '10mg'$ , doing much better. States Remeron has helped with sleep and appetite, pt has gained weight.  Hyperlipidemia: Patient is currently maintained on the following medication for hyperlipidemia: none - taking Crestor '20mg'$  qd. Patient reports good compliance with low fat/low cholesterol diet.  Last lipid panel as follows: Lab Results  Component Value Date   CHOL 294 (H) 08/18/2022   HDL 63 08/18/2022   LDLCALC 202 (H) 08/18/2022   TRIG 134 08/18/2022   CHOLHDL 4.7 08/18/2022   Assessment & Plan:   Problem List Items Addressed This Visit       Other   Hyperlipidemia    chronic pt  states she tolerated Crestor '20mg'$  qod so she started taking qd, asked about increasing dose d/t high LDL advised to continue qd dose, sending refill recheck fasting lipids in 70mo      Relevant Medications   rosuvastatin (CRESTOR) 20 MG tablet   Generalized anxiety disorder    chronic doing much better, Remeron helping w/sleep Taking Lexapro 10g qd, states some days she just takes '5mg'$ , advised this is fine, but she is wondering if she still needs at all since feeling better- advised to stay on at least a few more months will refill '5mg'$ , take 2 daily - ins. would not cover 1 tab bid f/u in 2 months      Relevant Medications   escitalopram (LEXAPRO) 5 MG tablet   mirtazapine (REMERON) 7.5 MG tablet    Other Visit Diagnoses     Need for immunization against influenza    -  Primary   Relevant Orders   Flu Vaccine QUAD High Dose(Fluad) (Completed)      Subjective:    Outpatient Medications Prior to Visit  Medication Sig Dispense Refill   Biotin 1 MG CAPS Take by mouth.     escitalopram (LEXAPRO) 5 MG tablet Take 1 tablet (5 mg total) by mouth in the morning and at bedtime. 60 tablet 1   hydrocortisone (ANUSOL-HC) 25 MG suppository Place 1 suppository (25 mg total) rectally 2 (two) times daily. (Patient taking differently: Place 25 mg rectally as needed.) 12 suppository 2   mirtazapine (REMERON) 7.5 MG tablet Take 1 tablet (7.5 mg total) by mouth at bedtime. 30 tablet 2   Multiple Vitamins-Minerals (MULTIVITAMIN WITH MINERALS) tablet Take 1 tablet by mouth daily.     rosuvastatin (CRESTOR) 20 MG tablet Take 1 tablet (20 mg total) by mouth every other day. 15 tablet 1   aspirin 81 MG tablet Take 81 mg by mouth daily. (Patient not taking: Reported on 08/18/2022)     GLUCOSAMINE CHONDROITIN COMPLX PO Take by mouth. Taking 2 daily (Patient not taking: Reported on 08/18/2022)     Omega-3 1000 MG CAPS Take by mouth. (Patient not taking: Reported on 08/18/2022)     No facility-administered medications  prior to visit.   Past Medical History:  Diagnosis Date   Anxiety    Endometrial cancer, grade I ([place]) 12/04/2010   s/p hysterectomy/BSO/washings;  [place].   Hyperlipidemia    Past Surgical History:  Procedure Laterality Date   ABDOMINAL HYSTERECTOMY  12/04/2010   uterine cancer; B oophorectomy.   BREAST DUCTAL SYSTEM EXCISION Right 08/04/2015   Procedure: RIGHT NIPPLE DUCT EXCISION;  Surgeon: Donnie Mesa, MD;  Location: [facility];  Service: General;  Laterality: Right;   BREAST EXCISIONAL BIOPSY Right 08/04/2015   duct excision   EXTRACORPOREAL SHOCK WAVE LITHOTRIPSY Left 06/17/2020   Procedure: LEFT EXTRACORPOREAL SHOCK WAVE LITHOTRIPSY (ESWL);  Surgeon: Remi Haggard, MD;   [facility];  Service: Urology;  Laterality: Left;   FOOT SURGERY     RT  BONE SPUR    Objective:   Today's Vitals: Pulse 75   Temp 98.2 F (36.8 C) (Temporal)   Ht '5\' 2"'$  (1.575 m)   Wt 123 lb 3.2 oz (55.9 kg)   SpO2 98%   BMI 22.53 kg/m   Physical Exam Vitals and nursing note reviewed.  Constitutional:      Appearance: Normal appearance.  Cardiovascular:     Rate and Rhythm: Normal rate and regular rhythm.  Pulmonary:     Effort: Pulmonary effort is normal.     Breath sounds: Normal breath sounds.  Musculoskeletal:        General: Normal range of motion.  Skin:    General: Skin is warm and dry.  Neurological:     Mental Status: She is alert.  Psychiatric:        Mood and Affect: Mood normal.        Behavior: Behavior normal.     Jeanie Sewer, NP

## 2022-10-18 NOTE — Patient Instructions (Addendum)
It was very nice to see you today!  Continue taking the Lexapro, 5 or '10mg'$  qd, refill sent for '5mg'$  tabs. OK to continue Remeron as needed and try Melatonin 3-'5mg'$  on nights you don't take the Remeron. This med also helped you gain weight, so taking as needed may help you level out & keep from gaining.  We can recheck your cholesterol numbers in 2 months, fasting after midnight, just black coffee or water in am with pills.  Glad you're doing better overall!  Hope you have a wonderful holiday season :-)      PLEASE NOTE:  If you had any lab tests please let us know if you have not heard back within a few days. You may see your results on MyChart before we have a chance to review them but we will give you a call once they are reviewed by [place]. If we ordered any referrals today, please let us know if you have not heard from their office within the next week.

## 2022-10-18 NOTE — Assessment & Plan Note (Addendum)
chronic pt  states she tolerated Crestor '20mg'$  qod so she started taking qd, asked about increasing dose d/t high LDL advised to continue qd dose, sending refill recheck fasting lipids in 45mo

## 2022-10-18 NOTE — Assessment & Plan Note (Addendum)
chronic doing much better, Remeron helping w/sleep Taking Lexapro 10g qd, states some days she just takes '5mg'$ , advised this is fine, but she is wondering if she still needs at all since feeling better- advised to stay on at least a few more months will refill '5mg'$ , take 2 daily - ins. would not cover 1 tab bid f/u in 2 months

## 2022-10-31 ENCOUNTER — Encounter: Payer: Self-pay | Admitting: Family

## 2022-11-01 ENCOUNTER — Other Ambulatory Visit: Payer: Self-pay

## 2022-11-01 DIAGNOSIS — F411 Generalized anxiety disorder: Secondary | ICD-10-CM

## 2022-11-01 MED ORDER — ESCITALOPRAM OXALATE 10 MG PO TABS: 10.0000 mg | ORAL_TABLET | Freq: Every day | ORAL | 1 refills | Status: DC

## 2022-11-21 ENCOUNTER — Ambulatory Visit (INDEPENDENT_AMBULATORY_CARE_PROVIDER_SITE_OTHER): Payer: Medicare Other

## 2022-11-21 VITALS — Wt 123.0 lb

## 2022-11-21 DIAGNOSIS — Z0000 Encounter for general adult medical examination without abnormal findings: Secondary | ICD-10-CM

## 2022-11-21 DIAGNOSIS — Z1211 Encounter for screening for malignant neoplasm of colon: Secondary | ICD-10-CM | POA: Diagnosis not present

## 2022-11-21 DIAGNOSIS — Z1231 Encounter for screening mammogram for malignant neoplasm of breast: Secondary | ICD-10-CM

## 2022-11-21 DIAGNOSIS — M810 Age-related osteoporosis without current pathological fracture: Secondary | ICD-10-CM

## 2022-11-21 NOTE — Progress Notes (Signed)
I connected with  Kelli Mclean on 11/21/22 by a audio enabled telemedicine application and verified that I am speaking with the correct person using two identifiers.  Patient Location: Home  Provider Location: Office/Clinic  I discussed the limitations of evaluation and management by telemedicine. The patient expressed understanding and agreed to proceed.   Subjective:   Kelli Mclean is a 66 y.o. female who presents for an Initial Medicare Annual Wellness Visit.  Review of Systems     Cardiac Risk Factors include: advanced age (>34mn, >>36women);dyslipidemia     Objective:    Today's Vitals   11/21/22 1503  Weight: 123 lb (55.8 kg)   Body mass index is 22.5 kg/m.     11/21/2022    3:07 PM 06/17/2020   12:21 PM 07/28/2015   11:36 AM 01/26/2015    8:06 AM 01/19/2015   10:30 AM  Advanced Directives  Does Patient Have a Medical Advance Directive? No No No No No  Would patient like information on creating a medical advance directive? No - Patient declined No - Patient declined No - patient declined information No - patient declined information No - patient declined information    Current Medications (verified) Outpatient Encounter Medications as of 11/21/2022  Medication Sig   escitalopram (LEXAPRO) 10 MG tablet Take 1 tablet (10 mg total) by mouth daily.   hydrocortisone (ANUSOL-HC) 25 MG suppository Place 1 suppository (25 mg total) rectally 2 (two) times daily. (Patient taking differently: Place 25 mg rectally as needed.)   mirtazapine (REMERON) 7.5 MG tablet Take 1 tablet (7.5 mg total) by mouth at bedtime.   Multiple Vitamins-Minerals (MULTIVITAMIN WITH MINERALS) tablet Take 1 tablet by mouth daily.   rosuvastatin (CRESTOR) 20 MG tablet Take 1 tablet (20 mg total) by mouth every other day.   [DISCONTINUED] Biotin 1 MG CAPS Take by mouth.   No facility-administered encounter medications on file as of 11/21/2022.    Allergies (verified) Patient has no known  allergies.   History: Past Medical History:  Diagnosis Date   Anxiety    Endometrial cancer, grade I ([facility] 12/04/2010   s/p hysterectomy/BSO/washings;  [facility].   Flank pain 03/26/2020   Hyperlipidemia    Past Surgical History:  Procedure Laterality Date   ABDOMINAL HYSTERECTOMY  12/04/2010   uterine cancer; B oophorectomy.   BREAST DUCTAL SYSTEM EXCISION Right 08/04/2015   Procedure: RIGHT NIPPLE DUCT EXCISION;  Surgeon: MDonnie Mesa MD;  [facility]  Service: General;  Laterality: Right;   BREAST EXCISIONAL BIOPSY Right 08/04/2015   duct excision   EXTRACORPOREAL SHOCK WAVE LITHOTRIPSY Left 06/17/2020   Procedure: LEFT EXTRACORPOREAL SHOCK WAVE LITHOTRIPSY (ESWL);  Surgeon: NRemi Haggard MD;  [facility]  Service: Urology;  Laterality: Left;   FOOT SURGERY     RT  BONE SPUR   Family History  Problem Relation Age of Onset   Heart disease Mother 852      CABG/CAD/AMI   Hyperlipidemia Mother    Heart failure Father    Dementia Father    Heart disease Paternal Grandfather    Diabetes Brother    Hyperlipidemia Brother    Heart disease Brother 651      AMI x 2 with stenting   Heart disease Maternal Grandfather    Heart disease Brother    Hyperlipidemia Brother    Colon cancer Neg Hx    Social History   Socioeconomic History   Marital status: Married  Spouse name: Not on file   Number of children: 3   Years of education: Not on file   Highest education level: Not on file  Occupational History   Occupation: FLORIST  Tobacco Use   Smoking status: Never   Smokeless tobacco: Never  Vaping Use   Vaping Use: Never used  Substance and Sexual Activity   Alcohol use: No    Alcohol/week: 0.0 standard drinks of alcohol   Drug use: No   Sexual activity: Yes    Birth control/protection: Post-menopausal, Surgical  Other Topics Concern   Not on file  Social History Narrative   Marital status:  Married x 35 years; happily married.       Children: 3 children; no grandchildren.      Lives: with husband.        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        Tobacco:  None      Alcohol: none      Exercise: three days per week ;[employer].  [place].         Social Determinants of Health   Financial Resource Strain: Low Risk  (11/21/2022)   Overall Financial Resource Strain (CARDIA)    Difficulty of Paying Living Expenses: Not hard at all  Food Insecurity: No Food Insecurity (11/21/2022)   Hunger Vital Sign    Worried About Running Out of Food in the Last Year: Never true    Ran Out of Food in the Last Year: Never true  Transportation Needs: No Transportation Needs (11/21/2022)   PRAPARE - Hydrologist (Medical): No    Lack of Transportation (Non-Medical): No  Physical Activity: Inactive (11/21/2022)   Exercise Vital Sign    Days of Exercise per Week: 0 days    Minutes of Exercise per Session: 0 min  Stress: No Stress Concern Present (11/21/2022)   [place]    Feeling of Stress : Not at all  Social Connections: Moderately Integrated (11/21/2022)   Social Connection and Isolation Panel [NHANES]    Frequency of Communication with Friends and Family: More than three times a week    Frequency of Social Gatherings with Friends and Family: More than three times a week    Attends Religious Services: More than 4 times per year    Active Member of [employer] or Organizations: No    Attends Music therapist: Never    Marital Status: Married    Tobacco Counseling Counseling given: Not Answered   Clinical Intake:  Pre-visit preparation completed: Yes  Pain : No/denies pain     BMI - recorded: 22.5 Nutritional Risks: None Diabetes: No  How often do you need to have someone help you when you read instructions, pamphlets, or other written materials from your doctor or pharmacy?: 1  - Never  Diabetic?no  Interpreter Needed?: No  Information entered by :: Charlott Rakes, LPN   Activities of Daily Living    11/21/2022    3:07 PM 11/19/2022    8:15 PM  In your present state of health, do you have any difficulty performing the following activities:  Hearing? 0 0  Vision? 0 0  Difficulty concentrating or making decisions? 0 0  Walking or climbing stairs? 0 0  Dressing or bathing? 0 0  Doing errands, shopping? 0 0  Preparing Food and eating ? N N  Using the Toilet? N N  In the past six months, have you accidently leaked urine? N N  Do you have problems with loss of bowel control? N N  Managing your Medications? N N  Managing your Finances? N N  Housekeeping or managing your Housekeeping? N N    Patient Care Team: Jeanie Sewer, NP as PCP - General (Family Medicine)  Indicate any recent Medical Services you may have received from other than [facility] providers in the past year (date may be approximate).     Assessment:   This is a routine wellness examination for Kelli Mclean.  Hearing/Vision screen Hearing Screening - Comments:: Pt denies any hearing issues  Vision Screening - Comments:: Pt follows up with Dr Sabra Heck for annul eye exams   Dietary issues and exercise activities discussed: Current Exercise Habits: The patient does not participate in regular exercise at present   Goals Addressed             This Visit's Progress    Patient Stated       Start doing more exercise        Depression Screen    11/21/2022    3:05 PM 08/18/2022    3:32 PM 05/14/2020   10:12 AM 03/26/2020   10:45 AM 11/04/2019    3:26 PM 05/29/2019    5:22 PM 05/29/2019    4:29 PM  PHQ 2/9 Scores  PHQ - 2 Score 0 4 0 0 0 0 0  PHQ- 9 Score  13    3     Fall Risk    11/21/2022    3:07 PM 11/19/2022    8:15 PM 10/18/2022   10:25 AM 08/18/2022    3:01 PM 05/14/2020   10:12 AM  Fall Risk   Falls in the past year? 0 0 0 0 0  Number falls in past yr: 0 0 0 0 0   Injury with Fall? 0 0 0 0 0  Risk for fall due to : Impaired vision  No Fall Risks No Fall Risks   Follow up Falls prevention discussed  Falls evaluation completed Falls evaluation completed;Education provided Falls evaluation completed    [place]:  Any stairs in or around the home? No  If so, are there any without handrails? No  Home free of loose throw rugs in walkways, pet beds, electrical cords, etc? Yes  Adequate lighting in your home to reduce risk of falls? Yes   ASSISTIVE DEVICES UTILIZED TO PREVENT FALLS:  Life alert? No  Use of a cane, walker or w/c? No  Grab bars in the bathroom? Yes  Shower chair or bench in shower? No  Elevated toilet seat or a handicapped toilet? No   TIMED UP AND GO:  Was the test performed? No .   Cognitive Function:        11/21/2022    3:08 PM  6CIT Screen  What Year? 0 points  What month? 0 points  What time? 0 points  Count back from 20 0 points  Months in reverse 0 points  Repeat phrase 0 points  Total Score 0 points    Immunizations Immunization History  Administered Date(s) Administered   Fluad Quad(high Dose 65+) 10/18/2022   Influenza,inj,Quad PF,6+ Mos 11/18/2014, 10/04/2015, 10/02/2016, 01/09/2018, 11/18/2018, 10/02/2019   Tdap 11/18/2014, 05/13/2018    TDAP status: Up to date  Flu Vaccine status: Up to date  Pneumococcal vaccine status: Due, Education has been provided regarding the importance of this vaccine. Advised may  receive this vaccine at local pharmacy or Health Dept. Aware to provide a copy of the vaccination record if obtained from local pharmacy or Health Dept. Verbalized acceptance and understanding.  Covid-19 vaccine status: Completed vaccines  Qualifies for Shingles Vaccine? Yes   Zostavax completed No   Shingrix Completed?: No.    Education has been provided regarding the importance of this vaccine. Patient has been advised to call insurance company to determine  out of pocket expense if they have not yet received this vaccine. Advised may also receive vaccine at local pharmacy or Health Dept. Verbalized acceptance and understanding.  Screening Tests Health Maintenance  Topic Date Due   COVID-19 Vaccine (1) Never done   Zoster Vaccines- Shingrix (1 of 2) 01/18/2023 (Originally 05/19/2005)   Pneumonia Vaccine 20+ Years old (1 - PCV) 10/19/2023 (Originally 05/19/2020)   MAMMOGRAM  10/19/2023 (Originally 04/12/2022)   DEXA SCAN  10/19/2023 (Originally 05/19/2020)   COLONOSCOPY (Pts 45-59yr Insurance coverage will need to be confirmed)  10/19/2023 (Originally 01/27/2020)   Medicare Annual Wellness (AWV)  11/22/2023   DTaP/Tdap/Td (3 - Td or Tdap) 05/13/2028   INFLUENZA VACCINE  Completed   Hepatitis C Screening  Completed   HPV VACCINES  Aged Out    Health Maintenance  Health Maintenance Due  Topic Date Due   COVID-19 Vaccine (1) Never done    Colorectal cancer screening: Referral to GI placed 11/21/22. Pt aware the office will call re: appt.  Mammogram status: Ordered 11/21/22. Pt provided with contact info and advised to call to schedule appt.   Bone Density status: Ordered 11/21/22. Pt provided with contact info and advised to call to schedule appt.  Additional Screening:  Hepatitis C Screening:  Completed 10/04/15  Vision Screening: Recommended annual ophthalmology exams for early detection of glaucoma and other disorders of the eye. Is the patient up to date with their annual eye exam?  Yes  Who is the provider or what is the name of the office in which the patient attends annual eye exams? Dr MSabra Heck If pt is not established with a provider, would they like to be referred to a provider to establish care? No .   Dental Screening: Recommended annual dental exams for proper oral hygiene  Community Resource Referral / Chronic Care Management: CRR required this visit?  No   CCM required this visit?  No      Plan:     I have  personally reviewed and noted the following in the patient's chart:   Medical and social history Use of alcohol, tobacco or illicit drugs  Current medications and supplements including opioid prescriptions. Patient is not currently taking opioid prescriptions. Functional ability and status Nutritional status Physical activity Advanced directives List of other physicians Hospitalizations, surgeries, and ER visits in previous 12 months Vitals Screenings to include cognitive, depression, and falls Referrals and appointments  In addition, I have reviewed and discussed with patient certain preventive protocols, quality metrics, and best practice recommendations. A written personalized care plan for preventive services as well as general preventive health recommendations were provided to patient.     TWillette Brace LPN   [id number]  Nurse Notes: none

## 2022-11-21 NOTE — Patient Instructions (Signed)
Kelli Mclean , Thank you for taking time to come for your Medicare Wellness Visit. I appreciate your ongoing commitment to your health goals. Please review the following plan we discussed and let me know if I can assist you in the future.   These are the goals we discussed:  Goals      Patient Stated     Start doing more exercise         This is a list of the screening recommended for you and due dates:  Health Maintenance  Topic Date Due   COVID-19 Vaccine (1) Never done   Zoster (Shingles) Vaccine (1 of 2) 01/18/2023*   Pneumonia Vaccine (1 - PCV) 10/19/2023*   Mammogram  10/19/2023*   DEXA scan (bone density measurement)  10/19/2023*   Colon Cancer Screening  10/19/2023*   Medicare Annual Wellness Visit  11/22/2023   DTaP/Tdap/Td vaccine (3 - Td or Tdap) 05/13/2028   Flu Shot  Completed   Hepatitis C Screening: USPSTF Recommendation to screen - Ages 18-79 yo.  Completed   HPV Vaccine  Aged Out  *Topic was postponed. The date shown is not the original due date.    Advanced directives: Advance directive discussed with you today. Even though you declined this today please call our office should you change your mind and we can give you the proper paperwork for you to fill out.  Conditions/risks identified: start doing more exercise   Next appointment: Follow up in one year for your annual wellness visit    Preventive Care 65 Years and Older, Female Preventive care refers to lifestyle choices and visits with your health care provider that can promote health and wellness. What does preventive care include? A yearly physical exam. This is also called an annual well check. Dental exams once or twice a year. Routine eye exams. Ask your health care provider how often you should have your eyes checked. Personal lifestyle choices, including: Daily care of your teeth and gums. Regular physical activity. Eating a healthy diet. Avoiding tobacco and drug use. Limiting alcohol  use. Practicing safe sex. Taking low-dose aspirin every day. Taking vitamin and mineral supplements as recommended by your health care provider. What happens during an annual well check? The services and screenings done by your health care provider during your annual well check will depend on your age, overall health, lifestyle risk factors, and family history of disease. Counseling  Your health care provider may ask you questions about your: Alcohol use. Tobacco use. Drug use. Emotional well-being. Home and relationship well-being. Sexual activity. Eating habits. History of falls. Memory and ability to understand (cognition). Work and work Statistician. Reproductive health. Screening  You may have the following tests or measurements: Height, weight, and BMI. Blood pressure. Lipid and cholesterol levels. These may be checked every 5 years, or more frequently if you are over 59 years old. Skin check. Lung cancer screening. You may have this screening every year starting at age 21 if you have a 30-pack-year history of smoking and currently smoke or have quit within the past 15 years. Fecal occult blood test (FOBT) of the stool. You may have this test every year starting at age 65. Flexible sigmoidoscopy or colonoscopy. You may have a sigmoidoscopy every 5 years or a colonoscopy every 10 years starting at age 18. Hepatitis C blood test. Hepatitis B blood test. Sexually transmitted disease (STD) testing. Diabetes screening. This is done by checking your blood sugar (glucose) after you have not eaten for a  while (fasting). You may have this done every 1-3 years. Bone density scan. This is done to screen for osteoporosis. You may have this done starting at age 69. Mammogram. This may be done every 1-2 years. Talk to your health care provider about how often you should have regular mammograms. Talk with your health care provider about your test results, treatment options, and if necessary,  the need for more tests. Vaccines  Your health care provider may recommend certain vaccines, such as: Influenza vaccine. This is recommended every year. Tetanus, diphtheria, and acellular pertussis (Tdap, Td) vaccine. You may need a Td booster every 10 years. Zoster vaccine. You may need this after age 29. Pneumococcal 13-valent conjugate (PCV13) vaccine. One dose is recommended after age 55. Pneumococcal polysaccharide (PPSV23) vaccine. One dose is recommended after age 72. Talk to your health care provider about which screenings and vaccines you need and how often you need them. This information is not intended to replace advice given to you by your health care provider. Make sure you discuss any questions you have with your health care provider. Document Released: 12/17/2015 Document Revised: 08/09/2016 Document Reviewed: 09/21/2015 Elsevier Interactive Patient Education  2017 [place] Prevention in the Home Falls can cause injuries. They can happen to people of all ages. There are many things you can do to make your home safe and to help prevent falls. What can I do on the outside of my home? Regularly fix the edges of walkways and driveways and fix any cracks. Remove anything that might make you trip as you walk through a door, such as a raised step or threshold. Trim any bushes or trees on the path to your home. Use bright outdoor lighting. Clear any walking paths of anything that might make someone trip, such as rocks or tools. Regularly check to see if handrails are loose or broken. Make sure that both sides of any steps have handrails. Any raised decks and porches should have guardrails on the edges. Have any leaves, snow, or ice cleared regularly. Use sand or salt on walking paths during winter. Clean up any spills in your garage right away. This includes oil or grease spills. What can I do in the bathroom? Use night lights. Install grab bars by the toilet and in the  tub and shower. Do not use towel bars as grab bars. Use non-skid mats or decals in the tub or shower. If you need to sit down in the shower, use a plastic, non-slip stool. Keep the floor dry. Clean up any water that spills on the floor as soon as it happens. Remove soap buildup in the tub or shower regularly. Attach bath mats securely with double-sided non-slip rug tape. Do not have throw rugs and other things on the floor that can make you trip. What can I do in the bedroom? Use night lights. Make sure that you have a light by your bed that is easy to reach. Do not use any sheets or blankets that are too big for your bed. They should not hang down onto the floor. Have a firm chair that has side arms. You can use this for support while you get dressed. Do not have throw rugs and other things on the floor that can make you trip. What can I do in the kitchen? Clean up any spills right away. Avoid walking on wet floors. Keep items that you use a lot in easy-to-reach places. If you need to reach something above you,  use a strong step stool that has a grab bar. Keep electrical cords out of the way. Do not use floor polish or wax that makes floors slippery. If you must use wax, use non-skid floor wax. Do not have throw rugs and other things on the floor that can make you trip. What can I do with my stairs? Do not leave any items on the stairs. Make sure that there are handrails on both sides of the stairs and use them. Fix handrails that are broken or loose. Make sure that handrails are as long as the stairways. Check any carpeting to make sure that it is firmly attached to the stairs. Fix any carpet that is loose or worn. Avoid having throw rugs at the top or bottom of the stairs. If you do have throw rugs, attach them to the floor with carpet tape. Make sure that you have a light switch at the top of the stairs and the bottom of the stairs. If you do not have them, ask someone to add them for  you. What else can I do to help prevent falls? Wear shoes that: Do not have high heels. Have rubber bottoms. Are comfortable and fit you well. Are closed at the toe. Do not wear sandals. If you use a stepladder: Make sure that it is fully opened. Do not climb a closed stepladder. Make sure that both sides of the stepladder are locked into place. Ask someone to hold it for you, if possible. Clearly mark and make sure that you can see: Any grab bars or handrails. First and last steps. Where the edge of each step is. Use tools that help you move around (mobility aids) if they are needed. These include: Canes. Walkers. Scooters. Crutches. Turn on the lights when you go into a dark area. Replace any light bulbs as soon as they burn out. Set up your furniture so you have a clear path. Avoid moving your furniture around. If any of your floors are uneven, fix them. If there are any pets around you, be aware of where they are. Review your medicines with your doctor. Some medicines can make you feel dizzy. This can increase your chance of falling. Ask your doctor what other things that you can do to help prevent falls. This information is not intended to replace advice given to you by your health care provider. Make sure you discuss any questions you have with your health care provider. Document Released: 09/16/2009 Document Revised: 04/27/2016 Document Reviewed: 12/25/2014 Elsevier Interactive Patient Education  2017 [employer].

## 2022-12-20 ENCOUNTER — Encounter: Payer: Self-pay | Admitting: Family

## 2022-12-20 ENCOUNTER — Ambulatory Visit (INDEPENDENT_AMBULATORY_CARE_PROVIDER_SITE_OTHER): Payer: Medicare Other | Admitting: Family

## 2022-12-20 VITALS — BP 119/68 | HR 70 | Temp 97.7°F | Ht 62.0 in | Wt 120.2 lb

## 2022-12-20 DIAGNOSIS — F411 Generalized anxiety disorder: Secondary | ICD-10-CM | POA: Diagnosis not present

## 2022-12-20 DIAGNOSIS — M25541 Pain in joints of right hand: Secondary | ICD-10-CM

## 2022-12-20 DIAGNOSIS — M79622 Pain in left upper arm: Secondary | ICD-10-CM | POA: Diagnosis not present

## 2022-12-20 DIAGNOSIS — E7849 Other hyperlipidemia: Secondary | ICD-10-CM

## 2022-12-20 LAB — LIPID PANEL
Cholesterol: 158 mg/dL (ref 0–200)
HDL: 57.4 mg/dL (ref >39.00)
LDL Cholesterol: 83 mg/dL (ref 0–99)
NonHDL: 100.73
Total CHOL/HDL Ratio: 3
Triglycerides: 91 mg/dL (ref 0.0–149.0)
VLDL: 18.2 mg/dL (ref 0.0–40.0)

## 2022-12-20 MED ORDER — HYDROXYZINE HCL 10 MG PO TABS: 10.0000 mg | ORAL_TABLET | Freq: Three times a day (TID) | ORAL | 2 refills | Status: DC | PRN

## 2022-12-20 NOTE — Assessment & Plan Note (Addendum)
chronic doing well on Lexapro 10g qd & Remeron prn for sleep does report having an occasional mild panic attack and concerned it may be heart related sending Hydroxyzine '10mg'$  to take prn, advised on use & SE. no refills needed today f/u in 6 months

## 2022-12-20 NOTE — Patient Instructions (Addendum)
It was very nice to see you today!  I will review your lab results via MyChart in a few days.  I have sent in the Hydroxyzine to take as needed for anxiety.  Here are is the supplement info for muscle soreness/arthritis pain:  Turmeric (Curcumin) with Bioperine (1500-2,'000mg'$ /day in divided doses) is an anti-inflammatory to help with arthritis pain/stiffness.  Magnesium Glycinate or L-Threonate (Magtein) - up to 2,'000mg'$ /day in divided doses- are easier on the bowels vs. Mag. Citrate or Oxide. They can also help with lowering blood pressure, anxiety, muscle recovery, improved brain function, and if taken before bedtime can help with maintaining your sleep (not falling asleep).  For any over the counter supplement or vitamin, look for chelated form (better absorbability) and either organic or have a 3rd party seal from NSF international, [employer] or USP. This authenticates the quality but not the efficacy (since not FDA approved).         PLEASE NOTE:  If you had any lab tests please let us know if you have not heard back within a few days. You may see your results on MyChart before we have a chance to review them but we will give you a call once they are reviewed by [place]. If we ordered any referrals today, please let us know if you have not heard from their office within the next week.

## 2022-12-20 NOTE — Assessment & Plan Note (Signed)
chronic taking Crestor '20mg'$  qd  rechecking fasting lipids today no refill needed f/u 6-12 mos

## 2022-12-20 NOTE — Progress Notes (Signed)
Patient ID: Kelli Mclean, female    DOB: 11-08-1956, 67 y.o.   MRN: [id number]  Chief Complaint  Patient presents with   Anxiety    Fasting w / Labs   Arm Pain    Pt states she has been having left arm pain since back in December off and on, Along with right hang tingling and numbness.     HPI:      Anxiety:  seen by [facility] recently started on Remeron 7.'5mg'$  qhs used to take Prozac a couple of years ago, titrated up to '40mg'$  about 2 years ago.  She is concerned if this is safe with Remeron or if she still needs. She is sleeping ok, still wakes around 3:30-4am some nights, but appetite is a little better. Still reports anxiety during the day, pt tearful in the office discussing family problems, recent house disaster and having to move out. Hyperlipidemia: Patient is currently maintained on the following medication for hyperlipidemia: none - used to take Crestor. Stopped as she heard taking statins long time is not good. Patient reports good compliance with low fat/low cholesterol diet.    Left arm pain/right hand pain:  having pain in right hand, describes as a dull ache, stiffness mostly in the mornings, does not last all day, denies any neuropathies. Also having muscle soreness in left upper arm. Denies any shoulder pain, no neuropathy, comes and goes as a sharp pain lasts a few minutes and goes away, she is more concerned if it is coming from her heart.  Assessment & Plan:   Problem List Items Addressed This Visit       Other   Hyperlipidemia - Primary    chronic taking Crestor '20mg'$  qd  rechecking fasting lipids today no refill needed f/u 6-12 mos      Relevant Orders   Lipid panel   Generalized anxiety disorder    chronic doing well on Lexapro 10g qd & Remeron prn for sleep does report having an occasional mild panic attack and concerned it may be heart related sending Hydroxyzine '10mg'$  to take prn, advised on use & SE. no refills needed today f/u in 6  months      Relevant Medications   hydrOXYzine (ATARAX) 10 MG tablet   Other Visit Diagnoses     Left upper arm pain    - reassured pt with no other heart related sx the off & on pain in her arm does not sound of cardiac origin. Advised to monitor, let me know or seek ER if  any other sx incl any SOB, chest tightness, tingling, nausea or other general uneasy feeling with the arm pain.    Pain in joint of right hand    - based on sx, believe she has beginning sx of arthritis. Advised pt on OTC meds, Turmeric w/curcumin and Magnesium, advised on type & dose to look for, how to read labels, authentication seals, use & SE. Can also take generic Aleve 1-2 pills twice a day prn. Avoid hands getting cold & high simple carb/sweets diet.which can aggravate symptoms. f/u if sx are not improving or if getting worse.   Subjective:    Outpatient Medications Prior to Visit  Medication Sig Dispense Refill   escitalopram (LEXAPRO) 10 MG tablet Take 1 tablet (10 mg total) by mouth daily. 90 tablet 1   hydrocortisone (ANUSOL-HC) 25 MG suppository Place 1 suppository (25 mg total) rectally 2 (two) times daily. (Patient taking differently: Place 25 mg rectally  as needed.) 12 suppository 2   mirtazapine (REMERON) 7.5 MG tablet Take 1 tablet (7.5 mg total) by mouth at bedtime. 30 tablet 2   Multiple Vitamins-Minerals (MULTIVITAMIN WITH MINERALS) tablet Take 1 tablet by mouth daily.     rosuvastatin (CRESTOR) 20 MG tablet Take 1 tablet (20 mg total) by mouth every other day. 90 tablet 1   No facility-administered medications prior to visit.   Past Medical History:  Diagnosis Date   Anxiety    Endometrial cancer, grade I ([place]) 12/04/2010   s/p hysterectomy/BSO/washings;  [facility] and [facility].   Flank pain 03/26/2020   Hyperlipidemia    Past Surgical History:  Procedure Laterality Date   ABDOMINAL HYSTERECTOMY  12/04/2010   uterine cancer; B oophorectomy.   BREAST DUCTAL SYSTEM EXCISION Right 08/04/2015    Procedure: RIGHT NIPPLE DUCT EXCISION;  Surgeon: Donnie Mesa, MD;  Location: [place];  Service: General;  Laterality: Right;   BREAST EXCISIONAL BIOPSY Right 08/04/2015   duct excision   EXTRACORPOREAL SHOCK WAVE LITHOTRIPSY Left 06/17/2020   Procedure: LEFT EXTRACORPOREAL SHOCK WAVE LITHOTRIPSY (ESWL);  Surgeon: Remi Haggard, MD;  Location: [facility];  Service: Urology;  Laterality: Left;   FOOT SURGERY     RT  BONE SPUR   No Known Allergies    Objective:    Physical Exam Vitals and nursing note reviewed.  Constitutional:      Appearance: Normal appearance.  Cardiovascular:     Rate and Rhythm: Normal rate and regular rhythm.  Pulmonary:     Effort: Pulmonary effort is normal.     Breath sounds: Normal breath sounds.  Musculoskeletal:     Left upper arm: No swelling, tenderness or bony tenderness.     Right hand: Swelling (mild) present. No bony tenderness. Decreased range of motion (stiffness when making a fist). Normal strength. Normal sensation.  Skin:    General: Skin is warm and dry.  Neurological:     Mental Status: She is alert.  Psychiatric:        Mood and Affect: Mood normal.        Behavior: Behavior normal.    BP 119/68 (BP Location: Left Arm, Patient Position: Sitting, Cuff Size: Large)   Pulse 70   Temp 97.7 F (36.5 C) (Temporal)   Ht '5\' 2"'$  (1.575 m)   Wt 120 lb 3.2 oz (54.5 kg)   SpO2 98%   BMI 21.98 kg/m  Wt Readings from Last 3 Encounters:  12/20/22 120 lb 3.2 oz (54.5 kg)  11/21/22 123 lb (55.8 kg)  10/18/22 123 lb 3.2 oz (55.9 kg)       Jeanie Sewer, NP

## 2022-12-21 NOTE — Progress Notes (Signed)
All your cholesterol numbers look great! Continue same dose of your Crestor and we'll recheck again in 6 months or 1 year.

## 2023-01-09 ENCOUNTER — Encounter: Payer: Self-pay | Admitting: Gastroenterology

## 2023-01-14 ENCOUNTER — Other Ambulatory Visit: Payer: Self-pay | Admitting: Family

## 2023-01-14 DIAGNOSIS — F411 Generalized anxiety disorder: Secondary | ICD-10-CM

## 2023-01-25 ENCOUNTER — Ambulatory Visit (AMBULATORY_SURGERY_CENTER): Payer: Medicare Other

## 2023-01-25 VITALS — Ht 62.0 in | Wt 120.0 lb

## 2023-01-25 DIAGNOSIS — Z86010 Personal history of colonic polyps: Secondary | ICD-10-CM

## 2023-01-25 MED ORDER — PEG 3350-KCL-NA BICARB-NACL 420 G PO SOLR: 4000.0000 mL | Freq: Once | ORAL | 0 refills | Status: AC

## 2023-01-25 NOTE — Progress Notes (Signed)

## 2023-01-31 ENCOUNTER — Other Ambulatory Visit: Payer: Self-pay | Admitting: Family Medicine

## 2023-01-31 ENCOUNTER — Ambulatory Visit
Admission: RE | Admit: 2023-01-31 | Discharge: 2023-01-31 | Disposition: A | Discharge status: Home / Self Care | Payer: Medicare Other | Source: Ambulatory Visit | Attending: Family Medicine | Admitting: Family Medicine

## 2023-01-31 DIAGNOSIS — Z0000 Encounter for general adult medical examination without abnormal findings: Secondary | ICD-10-CM

## 2023-01-31 DIAGNOSIS — Z1231 Encounter for screening mammogram for malignant neoplasm of breast: Secondary | ICD-10-CM

## 2023-01-31 DIAGNOSIS — M810 Age-related osteoporosis without current pathological fracture: Secondary | ICD-10-CM

## 2023-01-31 DIAGNOSIS — Z1211 Encounter for screening for malignant neoplasm of colon: Secondary | ICD-10-CM

## 2023-02-06 ENCOUNTER — Other Ambulatory Visit: Payer: Self-pay | Admitting: Family

## 2023-02-06 DIAGNOSIS — F411 Generalized anxiety disorder: Secondary | ICD-10-CM

## 2023-02-12 ENCOUNTER — Encounter: Payer: Self-pay | Admitting: Family

## 2023-02-13 ENCOUNTER — Other Ambulatory Visit: Payer: Self-pay

## 2023-02-13 DIAGNOSIS — E7849 Other hyperlipidemia: Secondary | ICD-10-CM

## 2023-02-13 DIAGNOSIS — F411 Generalized anxiety disorder: Secondary | ICD-10-CM

## 2023-02-13 MED ORDER — ROSUVASTATIN CALCIUM 20 MG PO TABS: 20.0000 mg | ORAL_TABLET | ORAL | 1 refills | Status: DC

## 2023-02-13 MED ORDER — ESCITALOPRAM OXALATE 10 MG PO TABS: 10.0000 mg | ORAL_TABLET | Freq: Every day | ORAL | 1 refills | Status: DC

## 2023-02-21 ENCOUNTER — Encounter: Payer: Self-pay | Admitting: Gastroenterology

## 2023-02-21 ENCOUNTER — Ambulatory Visit (AMBULATORY_SURGERY_CENTER): Payer: Medicare Other | Admitting: Gastroenterology

## 2023-02-21 VITALS — BP 111/69 | HR 69 | Temp 97.1°F | Resp 18 | Ht 62.0 in | Wt 115.6 lb

## 2023-02-21 DIAGNOSIS — D120 Benign neoplasm of cecum: Secondary | ICD-10-CM | POA: Diagnosis not present

## 2023-02-21 DIAGNOSIS — Z86010 Personal history of colonic polyps: Secondary | ICD-10-CM | POA: Diagnosis not present

## 2023-02-21 DIAGNOSIS — Z09 Encounter for follow-up examination after completed treatment for conditions other than malignant neoplasm: Secondary | ICD-10-CM | POA: Diagnosis not present

## 2023-02-21 DIAGNOSIS — K635 Polyp of colon: Secondary | ICD-10-CM

## 2023-02-21 DIAGNOSIS — D125 Benign neoplasm of sigmoid colon: Secondary | ICD-10-CM

## 2023-02-21 DIAGNOSIS — D123 Benign neoplasm of transverse colon: Secondary | ICD-10-CM

## 2023-02-21 DIAGNOSIS — D122 Benign neoplasm of ascending colon: Secondary | ICD-10-CM

## 2023-02-21 MED ORDER — SODIUM CHLORIDE 0.9 % IV SOLN: 500.0000 mL | Freq: Once | INTRAVENOUS | Status: DC

## 2023-02-21 NOTE — Patient Instructions (Addendum)
- Resume previous diet. - Continue present medications. - Await pathology results. - Repeat colonoscopy in 6 months for surveillance given the piecemeal resection of the mid-ascending colon polyp. - Follow a high fiber diet. Drink at least 64 ounces of water daily. Add a daily stool bulking agent such as psyllium (an example would be Metamucil). - Emerging evidence supports eating a diet of fruits, vegetables, grains, calcium, and yogurt while reducing red meat and alcohol may reduce the  risk of colon cancer. - Thank you for allowing me to be involved in your colon cancer prevention.  YOU HAD AN ENDOSCOPIC PROCEDURE TODAY AT [facility]:   Refer to the procedure report that was given to you for any specific questions about what was found during the examination.  If the procedure report does not answer your questions, please call your gastroenterologist to clarify.  If you requested that your care partner not be given the details of your procedure findings, then the procedure report has been included in a sealed envelope for you to review at your convenience later.  YOU SHOULD EXPECT: Some feelings of bloating in the abdomen. Passage of more gas than usual.  Walking can help get rid of the air that was put into your GI tract during the procedure and reduce the bloating. If you had a lower endoscopy (such as a colonoscopy or flexible sigmoidoscopy) you may notice spotting of blood in your stool or on the toilet paper. If you underwent a bowel prep for your procedure, you may not have a normal bowel movement for a few days.  Please Note:  You might notice some irritation and congestion in your nose or some drainage.  This is from the oxygen used during your procedure.  There is no need for concern and it should clear up in a day or so.  SYMPTOMS TO REPORT IMMEDIATELY:  Following lower endoscopy (colonoscopy or flexible sigmoidoscopy):  Excessive amounts of blood in the  stool  Significant tenderness or worsening of abdominal pains  Swelling of the abdomen that is new, acute  Fever of 100F or higher  For urgent or emergent issues, a gastroenterologist can be reached at any hour by calling [phone]. Do not use MyChart messaging for urgent concerns.    DIET:  We do recommend a small meal at first, but then you may proceed to your regular diet.  Drink plenty of fluids but you should avoid alcoholic beverages for 24 hours.  ACTIVITY:  You should plan to take it easy for the rest of today and you should NOT DRIVE or use heavy machinery until tomorrow (because of the sedation medicines used during the test).    FOLLOW UP: Our staff will call the number listed on your records the next business day following your procedure.  We will call around 7:15- 8:00 am to check on you and address any questions or concerns that you may have regarding the information given to you following your procedure. If we do not reach you, we will leave a message.     If any biopsies were taken you will be contacted by phone or by letter within the next 1-3 weeks.  Please call us at [phone] if you have not heard about the biopsies in 3 weeks.    SIGNATURES/CONFIDENTIALITY: You and/or your care partner have signed paperwork which will be entered into your electronic medical record.  These signatures attest to the fact that that the information above on your  After Visit Summary has been reviewed and is understood.  Full responsibility of the confidentiality of this discharge information lies with you and/or your care-partner.

## 2023-02-21 NOTE — Progress Notes (Signed)
Referring Provider: Jeanie Sewer, NP Primary Care Physician:  Jeanie Sewer, NP  Indication for Procedure:  Colon cancer Surveillance   IMPRESSION:  Need for colon cancer surveillance Appropriate candidate for monitored anesthesia care  PLAN: Colonoscopy in the [place] today   HPI: Kelli Mclean is a 67 y.o. female presents for surveillance colonoscopy.  Prior endoscopic history: Tubular adenoma on colonoscopy in 2010 Tubular adenoma and polypoid colonic mucosa removed on colonoscopy in 2016 Mild sigmoid diverticulosis seen on both procedures   No known family history of colon cancer or polyps. No family history of uterine/endometrial cancer, pancreatic cancer or gastric/stomach cancer.   Past Medical History:  Diagnosis Date   Anxiety    Endometrial cancer, grade I ([place]) 12/04/2010   s/p hysterectomy/BSO/washings;  [facility] and [facility].   Flank pain 03/26/2020   Hyperlipidemia     Past Surgical History:  Procedure Laterality Date   ABDOMINAL HYSTERECTOMY  12/04/2010   uterine cancer; B oophorectomy.   BREAST DUCTAL SYSTEM EXCISION Right 08/04/2015   Procedure: RIGHT NIPPLE DUCT EXCISION;  Surgeon: Donnie Mesa, MD;  Location: [place];  Service: General;  Laterality: Right;   BREAST EXCISIONAL BIOPSY Right 08/04/2015   duct excision   EXTRACORPOREAL SHOCK WAVE LITHOTRIPSY Left 06/17/2020   Procedure: LEFT EXTRACORPOREAL SHOCK WAVE LITHOTRIPSY (ESWL);  Surgeon: Remi Haggard, MD;  Location: [facility];  Service: Urology;  Laterality: Left;   FOOT SURGERY     RT  BONE SPUR    Current Outpatient Medications  Medication Sig Dispense Refill   escitalopram (LEXAPRO) 10 MG tablet Take 1 tablet (10 mg total) by mouth daily. 90 tablet 1   Melatonin 5 MG CHEW Chew by mouth.     Multiple Vitamins-Minerals (MULTIVITAMIN WITH MINERALS) tablet Take 1 tablet by mouth daily.     rosuvastatin (CRESTOR) 20 MG tablet Take 1 tablet (20 mg total) by mouth every  other day. 90 tablet 1   hydrocortisone (ANUSOL-HC) 25 MG suppository Place 1 suppository (25 mg total) rectally 2 (two) times daily. (Patient taking differently: Place 25 mg rectally as needed.) 12 suppository 2   hydrOXYzine (ATARAX) 10 MG tablet Take 1 tablet (10 mg total) by mouth 3 (three) times daily as needed for anxiety. 30 tablet 2   mirtazapine (REMERON) 7.5 MG tablet TAKE 1 TABLET(7.5 MG) BY MOUTH AT BEDTIME (Patient not taking: Reported on 01/25/2023) 90 tablet 0   Current Facility-Administered Medications  Medication Dose Route Frequency Provider Last Rate Last Admin   0.9 %  sodium chloride infusion  500 mL Intravenous Once Thornton Park, MD        Allergies as of 02/21/2023   (No Known Allergies)    Family History  Problem Relation Age of Onset   Breast cancer Mother 63   Heart disease Mother 72       CABG/CAD/AMI   Hyperlipidemia Mother    Heart failure Father    Dementia Father    Heart disease Maternal Grandfather    Heart disease Paternal Grandfather    Diabetes Brother    Hyperlipidemia Brother    Heart disease Brother 52       AMI x 2 with stenting   Heart disease Brother    Hyperlipidemia Brother    Colon cancer Neg Hx    Colon polyps Neg Hx    Esophageal cancer Neg Hx    Rectal cancer Neg Hx    Stomach cancer Neg Hx      Physical Exam:  General:   Alert,  well-nourished, pleasant and cooperative in NAD Head:  Normocephalic and atraumatic. Eyes:  Sclera clear, no icterus.   Conjunctiva pink. Mouth:  No deformity or lesions.   Neck:  Supple; no masses or thyromegaly. Lungs:  Clear throughout to auscultation.   No wheezes. Heart:  Regular rate and rhythm; no murmurs. Abdomen:  Soft, non-tender, nondistended, normal bowel sounds, no rebound or guarding.  Msk:  Symmetrical. No boney deformities LAD: No inguinal or umbilical LAD Extremities:  No clubbing or edema. Neurologic:  Alert and  oriented x4;  grossly nonfocal Skin:  No obvious rash or  bruise. Psych:  Alert and cooperative. Normal mood and affect.     Studies/Results: No results found.    Rahn Lacuesta L. Tarri Glenn, MD, MPH 02/21/2023, 10:15 AM

## 2023-02-21 NOTE — Op Note (Signed)
[place] Patient Name: Kelli Mclean Procedure Date: 02/21/2023 11:02 AM MRN: [id number] Endoscopist: Thornton Park MD, MD, [id number] Age: 67 Referring MD:  Date of Birth: 1956-07-16 Gender: Female Account #: [id number] Procedure:                Colonoscopy Indications:              Surveillance: Personal history of adenomatous                            polyps on last colonoscopy > 5 years ago                           Tubular adenoma on colonoscopy in 2010                           Tubular adenoma and polypoid colonic mucosa removed                            on colonoscopy in 2016 Medicines:                Monitored Anesthesia Care Procedure:                Pre-Anesthesia Assessment:                           - Prior to the procedure, a History and Physical                            was performed, and patient medications and                            allergies were reviewed. The patient's tolerance of                            previous anesthesia was also reviewed. The risks                            and benefits of the procedure and the sedation                            options and risks were discussed with the patient.                            All questions were answered, and informed consent                            was obtained. Prior Anticoagulants: The patient has                            taken no anticoagulant or antiplatelet agents. ASA                            Grade Assessment: II - A patient with mild systemic  disease. After reviewing the risks and benefits,                            the patient was deemed in satisfactory condition to                            undergo the procedure.                           After obtaining informed consent, the colonoscope                            was passed under direct vision. Throughout the                            procedure, the patient's blood pressure, pulse, and                             oxygen saturations were monitored continuously. The                            CF [id number] was introduced through the anus                            and advanced to the 3 cm into the ileum. The                            colonoscopy was performed without difficulty. The                            patient tolerated the procedure well. The quality                            of the bowel preparation was good. The terminal                            ileum, ileocecal valve, appendiceal orifice, and                            rectum were photographed. Scope In: 11:06:16 AM Scope Out: 11:30:50 AM Scope Withdrawal Time: 0 hours 17 minutes 43 seconds  Total Procedure Duration: 0 hours 24 minutes 34 seconds  Findings:                 Non-bleeding external and internal hemorrhoids were                            found.                           Multiple medium-mouthed and small-mouthed                            diverticula were found in the sigmoid colon and  descending colon.                           A 5 mm polyp was found in the sigmoid colon. The                            polyp was pedunculated. The polyp was removed with                            a cold snare. Resection and retrieval were                            complete. Estimated blood loss was minimal.                           A 3 mm polyp was found in the ascending colon. The                            polyp was sessile. The polyp was removed with a                            cold snare. Resection and retrieval were complete.                            Estimated blood loss was minimal.                           A 5 mm polyp was found in the cecum. The polyp was                            flat. The polyp was removed with a cold snare.                            Resection and retrieval were complete. Estimated                            blood loss was minimal.                            A 14 mm polyp was found in the proximal ascending                            colon on the fold adjacent to the IC valve. The                            polyp was umbilicated. The polyp was removed with a                            cold snare. Resection and retrieval were complete.                            Area was tattooed with an injection of 2 mL of Spot                            (  carbon black).                           A 3 mm polyp was found in the ascending colon. The                            polyp was sessile. The polyp was removed with a                            cold snare. Resection and retrieval were complete.                            Estimated blood loss was minimal.                           A 20 mm polyp was found in the mid ascending colon                            one fold from the tattoo. The polyp was                            carpet-like. Preparations were made for mucosal                            resection. Saline was injected to raise the lesion.                            Snare mucosal resection was performed. Resection                            and retrieval were complete. Resected tissue                            margins were examined and clear of polyp tissue.                           The exam was otherwise without abnormality on                            direct and retroflexion views. Complications:            No immediate complications. Estimated Blood Loss:     Estimated blood loss was minimal. Impression:               - Non-bleeding external and internal hemorrhoids.                           - Diverticulosis in the sigmoid colon and in the                            descending colon.                           - One 5 mm polyp in the sigmoid colon, removed with  a cold snare. Resected and retrieved.                           - One 3 mm polyp in the ascending colon, removed                            with a cold  snare. Resected and retrieved.                           - One 5 mm polyp in the cecum, removed with a cold                            snare. Resected and retrieved.                           - One 14 mm polyp in the proximal ascending colon,                            removed with a cold snare. Resected and retrieved.                            Tattooed.                           - One 3 mm polyp in the ascending colon, removed                            with a cold snare. Resected and retrieved.                           - One 20 mm polyp in the mid ascending colon,                            removed with mucosal resection. Resected and                            retrieved.                           - The examination was otherwise normal on direct                            and retroflexion views.                           - Mucosal resection was performed. Resection and                            retrieval were complete. Recommendation:           - Patient has a contact number available for                            emergencies. The signs and symptoms of potential  delayed complications were discussed with the                            patient. Return to normal activities tomorrow.                            Written discharge instructions were provided to the                            patient.                           - Resume previous diet.                           - Continue present medications.                           - Await pathology results.                           - Repeat colonoscopy in 6 months for surveillance                            given the piecemeal resection of the mid-ascending                            colon polyp.                           - Follow a high fiber diet. Drink at least 64                            ounces of water daily. Add a daily stool bulking                            agent such as psyllium (an exampled would  be                            Metamucil).                           - Emerging evidence supports eating a diet of                            fruits, vegetables, grains, calcium, and yogurt                            while reducing red meat and alcohol may reduce the                            risk of colon cancer.                           - Thank you for allowing me to be involved in your  colon cancer prevention. Thornton Park MD, MD 02/21/2023 11:41:52 AM This report has been signed electronically.

## 2023-02-21 NOTE — Progress Notes (Signed)
Pt's states no medical or surgical changes since previsit or office visit. 

## 2023-02-21 NOTE — Progress Notes (Signed)
Called to room to assist during endoscopic procedure.  Patient ID and intended procedure confirmed with present staff. Received instructions for my participation in the procedure from the performing physician.  

## 2023-02-22 ENCOUNTER — Telehealth: Payer: Self-pay | Admitting: *Deleted

## 2023-02-22 NOTE — Telephone Encounter (Signed)
  Follow up Call-     02/21/2023    9:44 AM  Call back number  Post procedure Call Back phone  # [phone]  Permission to leave phone message Yes     Patient questions:  Do you have a fever, pain , or abdominal swelling? No. Pain Score  0 *  Have you tolerated food without any problems? Yes.    Have you been able to return to your normal activities? Yes.    Do you have any questions about your discharge instructions: Diet   No. Medications  No. Follow up visit  No.  Do you have questions or concerns about your Care? No.  Actions: * If pain score is 4 or above: No action needed, pain <4.

## 2023-02-23 ENCOUNTER — Telehealth: Payer: Self-pay | Admitting: Gastroenterology

## 2023-02-23 NOTE — Telephone Encounter (Signed)
Patient called states she is still having issues with abdominal pain seeking advise. Also inquired about her path report.

## 2023-02-25 ENCOUNTER — Other Ambulatory Visit: Payer: Self-pay

## 2023-02-25 ENCOUNTER — Emergency Department (HOSPITAL_BASED_OUTPATIENT_CLINIC_OR_DEPARTMENT_OTHER): Payer: Medicare Other

## 2023-02-25 ENCOUNTER — Emergency Department (HOSPITAL_BASED_OUTPATIENT_CLINIC_OR_DEPARTMENT_OTHER)
Admission: EM | Admit: 2023-02-25 | Discharge: 2023-02-25 | Disposition: A | Discharge status: Home / Self Care | Payer: Medicare Other | Attending: Emergency Medicine | Admitting: Emergency Medicine

## 2023-02-25 ENCOUNTER — Encounter (HOSPITAL_BASED_OUTPATIENT_CLINIC_OR_DEPARTMENT_OTHER): Payer: Self-pay

## 2023-02-25 DIAGNOSIS — Z8542 Personal history of malignant neoplasm of other parts of uterus: Secondary | ICD-10-CM | POA: Diagnosis not present

## 2023-02-25 DIAGNOSIS — N3000 Acute cystitis without hematuria: Secondary | ICD-10-CM | POA: Insufficient documentation

## 2023-02-25 DIAGNOSIS — R1030 Lower abdominal pain, unspecified: Secondary | ICD-10-CM | POA: Diagnosis present

## 2023-02-25 LAB — CBC WITH DIFFERENTIAL/PLATELET
Abs Immature Granulocytes: 0.02 10*3/uL (ref 0.00–0.07)
Basophils Absolute: 0 10*3/uL (ref 0.0–0.1)
Basophils Relative: 1 %
Eosinophils Absolute: 0.1 10*3/uL (ref 0.0–0.5)
Eosinophils Relative: 2 %
HCT: 41.9 % (ref 36.0–46.0)
Hemoglobin: 14.1 g/dL (ref 12.0–15.0)
Immature Granulocytes: 0 %
Lymphocytes Relative: 26 %
Lymphs Abs: 1.6 10*3/uL (ref 0.7–4.0)
MCH: 31.1 pg (ref 26.0–34.0)
MCHC: 33.7 g/dL (ref 30.0–36.0)
MCV: 92.3 fL (ref 80.0–100.0)
Monocytes Absolute: 0.6 10*3/uL (ref 0.1–1.0)
Monocytes Relative: 10 %
Neutro Abs: 3.8 10*3/uL (ref 1.7–7.7)
Neutrophils Relative %: 61 %
Platelets: 213 10*3/uL (ref 150–400)
RBC: 4.54 MIL/uL (ref 3.87–5.11)
RDW: 13.2 % (ref 11.5–15.5)
WBC: 6.1 10*3/uL (ref 4.0–10.5)
nRBC: 0 % (ref 0.0–0.2)

## 2023-02-25 LAB — URINALYSIS, ROUTINE W REFLEX MICROSCOPIC
Bacteria, UA: NONE SEEN
Bilirubin Urine: NEGATIVE
Glucose, UA: NEGATIVE mg/dL
Hgb urine dipstick: NEGATIVE
Ketones, ur: NEGATIVE mg/dL
Nitrite: NEGATIVE
Protein, ur: NEGATIVE mg/dL
Specific Gravity, Urine: 1.01 (ref 1.005–1.030)
pH: 6 (ref 5.0–8.0)

## 2023-02-25 LAB — LIPASE, BLOOD: Lipase: 17 U/L (ref 11–51)

## 2023-02-25 LAB — COMPREHENSIVE METABOLIC PANEL
ALT: 18 U/L (ref 0–44)
AST: 22 U/L (ref 15–41)
Albumin: 4.7 g/dL (ref 3.5–5.0)
Alkaline Phosphatase: 71 U/L (ref 38–126)
Anion gap: 7 (ref 5–15)
BUN: 12 mg/dL (ref 8–23)
CO2: 30 mmol/L (ref 22–32)
Calcium: 9.4 mg/dL (ref 8.9–10.3)
Chloride: 102 mmol/L (ref 98–111)
Creatinine, Ser: 0.7 mg/dL (ref 0.44–1.00)
GFR, Estimated: >60 mL/min (ref >60)
Glucose, Bld: 96 mg/dL (ref 70–99)
Potassium: 3.8 mmol/L (ref 3.5–5.1)
Sodium: 139 mmol/L (ref 135–145)
Total Bilirubin: 0.9 mg/dL (ref 0.3–1.2)
Total Protein: 6.9 g/dL (ref 6.5–8.1)

## 2023-02-25 MED ORDER — IOHEXOL 300 MG/ML  SOLN
100.0000 mL | Freq: Once | INTRAMUSCULAR | Status: AC | PRN
  Administered 2023-02-25: 80 mL via INTRAVENOUS

## 2023-02-25 MED ORDER — CEPHALEXIN 500 MG PO CAPS: 500.0000 mg | ORAL_CAPSULE | Freq: Four times a day (QID) | ORAL | 0 refills | Status: AC

## 2023-02-25 MED ORDER — ONDANSETRON HCL 4 MG PO TABS: 4.0000 mg | ORAL_TABLET | Freq: Four times a day (QID) | ORAL | 0 refills | Status: DC

## 2023-02-25 NOTE — ED Triage Notes (Signed)
Patient presents with c/o generalized lower abd pain that started two weeks ago. +nausea without emesis. Patient reports palpation of area improves pain. Denies fever, chills, diarrhea, dysuria

## 2023-02-25 NOTE — ED Provider Notes (Signed)
[facility] Provider Note   CSN: [id number] Arrival date & time: 02/25/23  1318     History  Chief Complaint  Patient presents with   Abdominal Pain    Kelli Mclean is a 67 y.o. female. With past medical history of hyperlipidemia, endometrial cancer who presents to the emergency department with abdominal pain.   States she has had two weeks of abdominal pain. She describes it as dull, constant, lower abdominal pain that is non radiating. She has had associated nausea and headache. Her symptoms of headache and nausea have worsened over the past two weeks. No aggravating or alleviating symptoms. She had a colonoscopy Wednesday and had some polyps removed. She has been eating and drinking without difficulty. She denies fevers, vomiting, diarrhea, constipation, dysuria, vaginal discharge or previous abdominal surgeries. No inciting events prior to the start of symptoms. No chronic NSAID or alcohol use.    Abdominal Pain Associated symptoms: nausea        Home Medications Prior to Admission medications   Medication Sig Start Date End Date Taking? Authorizing Provider  cephALEXin (KEFLEX) 500 MG capsule Take 1 capsule (500 mg total) by mouth 4 (four) times daily for 5 days. 02/25/23 03/02/23 Yes Mickie Hillier, PA-C  ondansetron (ZOFRAN) 4 MG tablet Take 1 tablet (4 mg total) by mouth every 6 (six) hours. 02/25/23  Yes Mickie Hillier, PA-C  escitalopram (LEXAPRO) 10 MG tablet Take 1 tablet (10 mg total) by mouth daily. 02/13/23   Jeanie Sewer, NP  hydrocortisone (ANUSOL-HC) 25 MG suppository Place 1 suppository (25 mg total) rectally 2 (two) times daily. Patient taking differently: Place 25 mg rectally as needed. 05/29/19   Jacelyn Pi, Lilia Argue, MD  hydrOXYzine (ATARAX) 10 MG tablet Take 1 tablet (10 mg total) by mouth 3 (three) times daily as needed for anxiety. 12/20/22   Jeanie Sewer, NP  Melatonin 5 MG CHEW Chew by mouth.     [provider]  mirtazapine (REMERON) 7.5 MG tablet TAKE 1 TABLET(7.5 MG) BY MOUTH AT BEDTIME Patient not taking: Reported on 01/25/2023 01/15/23   Jeanie Sewer, NP  Multiple Vitamins-Minerals (MULTIVITAMIN WITH MINERALS) tablet Take 1 tablet by mouth daily.    [provider]  rosuvastatin (CRESTOR) 20 MG tablet Take 1 tablet (20 mg total) by mouth every other day. 02/13/23   Jeanie Sewer, NP      Allergies    Patient has no known allergies.    Review of Systems   Review of Systems  Gastrointestinal:  Positive for abdominal pain and nausea.  Neurological:  Positive for headaches.  All other systems reviewed and are negative.   Physical Exam Updated Vital Signs BP (!) 103/90 (BP Location: Right Arm)   Pulse 64   Temp 98.4 F (36.9 C) (Oral)   Resp 16   SpO2 100%  Physical Exam Vitals and nursing note reviewed.  Constitutional:      General: She is not in acute distress.    Appearance: Normal appearance. She is well-developed and normal weight. She is not ill-appearing or toxic-appearing.  HENT:     Head: Normocephalic.     Mouth/Throat:     Mouth: Mucous membranes are moist.     Pharynx: Oropharynx is clear.  Eyes:     General: No scleral icterus.    Extraocular Movements: Extraocular movements intact.  Cardiovascular:     Rate and Rhythm: Normal rate and regular rhythm.     Heart sounds:  Normal heart sounds. No murmur heard. Pulmonary:     Effort: Pulmonary effort is normal. No respiratory distress.  Abdominal:     General: Abdomen is flat. Bowel sounds are normal. There is no distension.     Palpations: Abdomen is soft.     Tenderness: There is abdominal tenderness in the right lower quadrant. There is no guarding or rebound.  Skin:    General: Skin is warm and dry.     Capillary Refill: Capillary refill takes less than 2 seconds.  Neurological:     General: No focal deficit present.     Mental Status: She is alert and oriented to  person, place, and time.  Psychiatric:        Mood and Affect: Mood normal.        Behavior: Behavior normal.     ED Results / Procedures / Treatments   Labs (all labs ordered are listed, but only abnormal results are displayed) Labs Reviewed  URINALYSIS, ROUTINE W REFLEX MICROSCOPIC - Abnormal; Notable for the following components:      Result Value   Leukocytes,Ua SMALL (*)    All other components within normal limits  URINE CULTURE  COMPREHENSIVE METABOLIC PANEL  LIPASE, BLOOD  CBC WITH DIFFERENTIAL/PLATELET    EKG None  Radiology CT Abdomen Pelvis W Contrast  Result Date: 02/25/2023 CLINICAL DATA:  Abdominal pain, acute, nonlocalized EXAM: CT ABDOMEN AND PELVIS WITH CONTRAST TECHNIQUE: Multidetector CT imaging of the abdomen and pelvis was performed using the standard protocol following bolus administration of intravenous contrast. RADIATION DOSE REDUCTION: This exam was performed according to the departmental dose-optimization program which includes automated exposure control, adjustment of the mA and/or kV according to patient size and/or use of iterative reconstruction technique. CONTRAST:  45mL OMNIPAQUE IOHEXOL 300 MG/ML  SOLN COMPARISON:  CT 05/28/2020 FINDINGS: Lower chest: No pleural or pericardial effusion. Hepatobiliary: No focal liver abnormality is seen. No gallstones, gallbladder wall thickening, or biliary dilatation. Pancreas: Unremarkable. No pancreatic ductal dilatation or surrounding inflammatory changes. Spleen: Normal in size without focal abnormality. Adrenals/Urinary Tract: No adrenal mass. Stable 1 mm calcification in the mid right renal collecting system. No hydronephrosis. Symmetric renal parenchymal enhancement. Urinary bladder physiologically distended. Stomach/Bowel: Stomach decompressed. Small bowel nondistended. Normal appendix. The colon is partially distended by gas and fecal material. Scattered distal descending and sigmoid diverticula without  significant adjacent inflammatory change or abscess. Vascular/Lymphatic: Minimal partially calcified aortic plaque without aneurysm. Portal vein patent. No abdominal or pelvic adenopathy. Reproductive: Status post hysterectomy. No adnexal masses. Other: No ascites.  No free air. Musculoskeletal: Lumbar facet disease L5-S1. No fracture or worrisome bone lesion. IMPRESSION: 1. No acute findings. 2. Stable nonobstructive right nephrolithiasis. 3. Descending and sigmoid diverticulosis. Electronically Signed   By: Lucrezia Europe M.D.   On: 02/25/2023 16:42    Procedures Procedures   Medications Ordered in ED Medications  iohexol (OMNIPAQUE) 300 MG/ML solution 100 mL (80 mLs Intravenous Contrast Given 02/25/23 1551)    ED Course/ Medical Decision Making/ A&P    Medical Decision Making Amount and/or Complexity of Data Reviewed Labs: ordered. Radiology: ordered.  Risk Prescription drug management.  Initial Impression and Ddx 67 year old female who presents to the emergency department with abdominal pain Patient PMH that increases complexity of ED encounter:  hyperlipidemia, endometrial cancer Differential: Acute hepatobiliary disease, pancreatitis, appendicitis, PUD, gastritis, SBO, diverticulitis, colitis, viral gastroenteritis, Crohn's, UC, vascular catastrophe, UTI, pyelonephritis, renal stone, obstructed stone, infected stone, ovarian torsion, ectopic pregnancy, TOA, PID, STD, etc.  Interpretation of Diagnostics I independent reviewed and interpreted the labs as followed: CBC without leukocytosis, CMP without electrolyte derangement, AKI, transaminitis, lipase negative, UA with leukocytes, 11-20 white blood cells.  I sent off urine culture which is pending  - I independently visualized the following imaging with scope of interpretation limited to determining acute life threatening conditions related to emergency care: CT abdomen Pelvis, which revealed no acute findings  Patient Reassessment  and Ultimate Disposition/Management Overall well-appearing, hemodynamically stable, afebrile. She has mild RLQ abdominal discomfort on palpation. No rebound tenderness. Not requesting any anti-nausea medication at this time.  Will get labs, CT given her age and chronicity of symptoms, history of endometrial cancer.   Reassessment with no vomiting.  No worsening abdominal pain. Lipase is negative No transaminitis, elevated bilirubin, upper abdominal pain, fever or evidence of acute hepatobiliary disease on imaging. CT without evidence of bowel obstruction, diverticulitis, appendicitis, vascular catastrophe, stone, obstructed stone.  No GU symptoms concerning for PID, TOA. Symptoms inconsistent with PUD or gastritis.  UA does show evidence of possible UTI.  Given her suprapubic/right lower quadrant abdominal pain and nausea we will treat her with Keflex.  I have sent off a urine culture.  Will also prescribe her Zofran.  Given her return precautions for worsening symptoms.  She verbalized understanding.  Otherwise feel that she is safe for discharge at this time.  The patient has been appropriately medically screened and/or stabilized in the ED. I have low suspicion for any other emergent medical condition which would require further screening, evaluation or treatment in the ED or require inpatient management. At time of discharge the patient is hemodynamically stable and in no acute distress. I have discussed work-up results and diagnosis with patient and answered all questions. Patient is agreeable with discharge plan. We discussed strict return precautions for returning to the emergency department and they verbalized understanding.     Patient management required discussion with the following services or consulting groups:  None  Complexity of Problems Addressed Acute complicated illness or Injury  Additional Data Reviewed and Analyzed Further history obtained from: Further history from  spouse/family member, Past medical history and medications listed in the EMR, and Care Everywhere  Patient Encounter Risk Assessment Prescriptions  Final Clinical Impression(s) / ED Diagnoses Final diagnoses:  Acute cystitis without hematuria    Rx / DC Orders ED Discharge Orders          Ordered    cephALEXin (KEFLEX) 500 MG capsule  4 times daily        02/25/23 1706    ondansetron (ZOFRAN) 4 MG tablet  Every 6 hours        02/25/23 1706              Mickie Hillier, PA-C 02/25/23 1710    Ezequiel Essex, MD 02/25/23 1737

## 2023-02-25 NOTE — Discharge Instructions (Signed)
You were seen in the emergency department today for nausea and lower abdominal pain. It appears you have a urinary tract infection. I am placing you on Keflex, an antibiotic, that you will take for the next 5 days. I have also prescribed you Zofran for nausea control. Please drink plenty of fluids. Eat a gentle diet. Rest. Please return to the ER for worsening abdominal pain, inability to drink liquids due to vomiting, or flank/back pain with fever.

## 2023-02-26 LAB — URINE CULTURE: Culture: NO GROWTH

## 2023-02-26 NOTE — Telephone Encounter (Signed)
Pt was seen at urgent care yesterday and had a UTI that was causing her discomfort. Let her know the path result is not back yet.

## 2023-02-27 ENCOUNTER — Telehealth: Payer: Self-pay

## 2023-02-27 NOTE — Transitions of Care (Post Inpatient/ED Visit) (Signed)
   02/27/2023  Name: Kelli Mclean MRN: [id number] DOB: [birth year]  Today's TOC FU Call Status: Today's TOC FU Call Status:: Unsuccessul Call (1st Attempt) Unsuccessful Call (1st Attempt) Date: 02/27/23  Attempted to reach the patient regarding the most recent Inpatient/ED visit.  Follow Up Plan: Additional outreach attempts will be made to reach the patient to complete the Transitions of Care (Post Inpatient/ED visit) call.   Signature Reymundo Poll, CMA

## 2023-02-27 NOTE — Telephone Encounter (Signed)
Patient declined ED f/u with PCP at this time.

## 2023-03-05 ENCOUNTER — Telehealth: Payer: Self-pay | Admitting: Gastroenterology

## 2023-03-05 NOTE — Telephone Encounter (Signed)
Left message on machine to call back  

## 2023-03-05 NOTE — Telephone Encounter (Signed)
The pt has been advised of the results of the recent path.  The note per Dr Tarri Glenn was read to her and all questions answered to the best of my ability.  She will be contacted in 6 months for recall colon.

## 2023-03-05 NOTE — Telephone Encounter (Signed)
Patient is calling states wishing to speak with a nurse regarding her recent results. Please advise

## 2023-06-20 ENCOUNTER — Encounter: Payer: Self-pay | Admitting: Family

## 2023-06-20 ENCOUNTER — Ambulatory Visit (INDEPENDENT_AMBULATORY_CARE_PROVIDER_SITE_OTHER): Payer: Medicare Other | Admitting: Family

## 2023-06-20 VITALS — BP 134/86 | HR 63 | Temp 97.5°F | Ht 62.0 in | Wt 121.5 lb

## 2023-06-20 DIAGNOSIS — Z8249 Family history of ischemic heart disease and other diseases of the circulatory system: Secondary | ICD-10-CM

## 2023-06-20 DIAGNOSIS — E7849 Other hyperlipidemia: Secondary | ICD-10-CM | POA: Diagnosis not present

## 2023-06-20 DIAGNOSIS — M79601 Pain in right arm: Secondary | ICD-10-CM | POA: Diagnosis not present

## 2023-06-20 DIAGNOSIS — F411 Generalized anxiety disorder: Secondary | ICD-10-CM

## 2023-06-20 MED ORDER — HYDROXYZINE HCL 10 MG PO TABS: 10.0000 mg | ORAL_TABLET | Freq: Three times a day (TID) | ORAL | 2 refills | Status: DC | PRN

## 2023-06-20 MED ORDER — ESCITALOPRAM OXALATE 10 MG PO TABS: 10.0000 mg | ORAL_TABLET | Freq: Every day | ORAL | 1 refills | Status: DC

## 2023-06-20 MED ORDER — ROSUVASTATIN CALCIUM 20 MG PO TABS: 20.0000 mg | ORAL_TABLET | ORAL | 1 refills | Status: DC

## 2023-06-20 NOTE — Assessment & Plan Note (Signed)
chronic, stable, last lipids wnl taking Crestor 20mg  qd  sending refill f/u 6 mos, fasting

## 2023-06-20 NOTE — Progress Notes (Signed)
Patient ID: Kelli Mclean, female    DOB: 01/06/1956, 67 y.o.   MRN: [id number]  Chief Complaint  Patient presents with   Hyperlipidemia   Arm Pain    Pt c/o right arm pain for 3 days off and on. Has tried aspirin which did help sx, No injury occurred.    Anxiety    HPI: Right arm pain:  starts in shoulder and goes down her arm, denies any tingling or numbness, pt is left handed, works in a greenhouse.  First started a month ago. She denies any decreased ROM in shoulder, just a continuing soreness in shoulder & arm & feels "heavy", sometimes feels nausea. Does not bother her while working, just when resting. Hyperlipidemia: Patient is currently maintained on the following medication for hyperlipidemia: Crestor. Patient denies myalgia or other side effects. Patient reports good compliance with low fat/low cholesterol diet.  Last lipid panel as follows: Lab Results  Component Value Date   CHOL 158 12/20/2022   HDL 57.40 12/20/2022   LDLCALC 83 12/20/2022   TRIG 91.0 12/20/2022   CHOLHDL 3 12/20/2022   Anxiety:  seen by [facility] recently started on Remeron 7.5mg  qhs used to take Prozac a couple of years ago, titrated up to 40mg  about 2 years ago.  She is concerned if this is safe with Remeron or if she still needs. She is sleeping ok, still wakes around 3:30-4am some nights, but appetite is a little better. Still reports anxiety during the day, pt tearful in the office discussing family problems, recent house disaster and having to move out.   Assessment & Plan:  Generalized anxiety disorder Assessment & Plan: chronic, unstable reports increased sx, cares for husb who has dementia, 94yo mother & 93yo mother-in-law taking Lexapro 10g qd & Hydroxyzine 10mg  to take prn continues to worry if anxiety r/t heart d/t family hx, today with right arm pain also advised ok to increase Lexapro to 15mg  for 2w and see if any difference in sx, if yes let me know sending refills  today f/u in 6 months  Orders: -     hydrOXYzine HCl; Take 1 tablet (10 mg total) by mouth 3 (three) times daily as needed for anxiety.  Dispense: 30 tablet; Refill: 2 -     Escitalopram Oxalate; Take 1 tablet (10 mg total) by mouth daily.  Dispense: 90 tablet; Refill: 1  Other hyperlipidemia Assessment & Plan: chronic, stable, last lipids wnl taking Crestor 20mg  qd  sending refill f/u 6 mos, fasting  Orders: -     Rosuvastatin Calcium; Take 1 tablet (20 mg total) by mouth every other day.  Dispense: 90 tablet; Refill: 1  Right arm pain - reassured pt I do not believe pain r/t cardiac origin, but d/t pt anxiety & family hx, will run coronary CT score. Advised pt on taking generic Aleve, 1-2 tabs bid for next 3-4d and see if improvement in sx. Also can apply heating pad up to tid prn. Let me know if pain is not improved in 2w.  -     CT CARDIAC SCORING (SELF PAY ONLY); Future  Family history of heart attack -     CT CARDIAC SCORING (SELF PAY ONLY); Future   Subjective:    Outpatient Medications Prior to Visit  Medication Sig Dispense Refill   hydrocortisone (ANUSOL-HC) 25 MG suppository Place 1 suppository (25 mg total) rectally 2 (two) times daily. (Patient taking differently: Place 25 mg rectally as needed.) 12 suppository  2   Multiple Vitamins-Minerals (MULTIVITAMIN WITH MINERALS) tablet Take 1 tablet by mouth daily.     escitalopram (LEXAPRO) 10 MG tablet Take 1 tablet (10 mg total) by mouth daily. 90 tablet 1   hydrOXYzine (ATARAX) 10 MG tablet Take 1 tablet (10 mg total) by mouth 3 (three) times daily as needed for anxiety. 30 tablet 2   Melatonin 5 MG CHEW Chew by mouth.     mirtazapine (REMERON) 7.5 MG tablet TAKE 1 TABLET(7.5 MG) BY MOUTH AT BEDTIME 90 tablet 0   ondansetron (ZOFRAN) 4 MG tablet Take 1 tablet (4 mg total) by mouth every 6 (six) hours. 12 tablet 0   rosuvastatin (CRESTOR) 20 MG tablet Take 1 tablet (20 mg total) by mouth every other day. 90 tablet 1    No facility-administered medications prior to visit.   Past Medical History:  Diagnosis Date   Anxiety    Endometrial cancer, grade I (HCC) 12/04/2010   s/p hysterectomy/BSO/washings;  [facility] and [facility].   Flank pain 03/26/2020   Hyperlipidemia    Pyelonephritis 03/26/2020   Past Surgical History:  Procedure Laterality Date   ABDOMINAL HYSTERECTOMY  12/04/2010   uterine cancer; B oophorectomy.   BREAST DUCTAL SYSTEM EXCISION Right 08/04/2015   Procedure: RIGHT NIPPLE DUCT EXCISION;  Surgeon: Manus Rudd, MD;  Location: [facility] OR;  Service: General;  Laterality: Right;   BREAST EXCISIONAL BIOPSY Right 08/04/2015   duct excision   EXTRACORPOREAL SHOCK WAVE LITHOTRIPSY Left 06/17/2020   Procedure: LEFT EXTRACORPOREAL SHOCK WAVE LITHOTRIPSY (ESWL);  Surgeon: Belva Agee, MD;  Location: [facility];  Service: Urology;  Laterality: Left;   FOOT SURGERY     RT  BONE SPUR   No Known Allergies    Objective:    Physical Exam Vitals and nursing note reviewed.  Constitutional:      Appearance: Normal appearance.  Cardiovascular:     Rate and Rhythm: Normal rate and regular rhythm.  Pulmonary:     Effort: Pulmonary effort is normal.     Breath sounds: Normal breath sounds.  Musculoskeletal:        General: Normal range of motion.     Right shoulder: No swelling, tenderness or bony tenderness. Normal range of motion. Normal strength.     Right forearm: No swelling, deformity or tenderness.  Skin:    General: Skin is warm and dry.  Neurological:     Mental Status: She is alert.  Psychiatric:        Mood and Affect: Mood normal.        Behavior: Behavior normal.    BP 134/86   Pulse 63   Temp (!) 97.5 F (36.4 C) (Temporal)   Ht 5\' 2"  (1.575 m)   Wt 121 lb 8 oz (55.1 kg)   SpO2 99%   BMI 22.22 kg/m  Wt Readings from Last 3 Encounters:  06/20/23 121 lb 8 oz (55.1 kg)  02/21/23 115 lb 9.6 oz (52.4 kg)  01/25/23 120 lb (54.4 kg)      Dulce Sellar,  NP

## 2023-06-20 NOTE — Assessment & Plan Note (Signed)
chronic, unstable reports increased sx, cares for husb who has dementia, 68yo mother & 93yo mother-in-law taking Lexapro 10g qd & Hydroxyzine 10mg  to take prn continues to worry if anxiety r/t heart d/t family hx, today with right arm pain also advised ok to increase Lexapro to 15mg  for 2w and see if any difference in sx, if yes let me know sending refills today f/u in 6 months

## 2023-07-05 ENCOUNTER — Other Ambulatory Visit: Payer: Self-pay | Admitting: Family

## 2023-07-05 DIAGNOSIS — F411 Generalized anxiety disorder: Secondary | ICD-10-CM

## 2023-07-10 ENCOUNTER — Ambulatory Visit
Admission: RE | Admit: 2023-07-10 | Discharge: 2023-07-10 | Disposition: A | Discharge status: Home / Self Care | Payer: Medicare Other | Source: Ambulatory Visit | Attending: Family Medicine | Admitting: Family Medicine

## 2023-07-10 DIAGNOSIS — Z0000 Encounter for general adult medical examination without abnormal findings: Secondary | ICD-10-CM

## 2023-07-10 DIAGNOSIS — Z1231 Encounter for screening mammogram for malignant neoplasm of breast: Secondary | ICD-10-CM

## 2023-07-10 DIAGNOSIS — Z1211 Encounter for screening for malignant neoplasm of colon: Secondary | ICD-10-CM

## 2023-07-10 DIAGNOSIS — M810 Age-related osteoporosis without current pathological fracture: Secondary | ICD-10-CM

## 2023-07-13 ENCOUNTER — Encounter: Payer: Self-pay | Admitting: Family

## 2023-07-13 DIAGNOSIS — M810 Age-related osteoporosis without current pathological fracture: Secondary | ICD-10-CM | POA: Insufficient documentation

## 2023-07-13 DIAGNOSIS — M85851 Other specified disorders of bone density and structure, right thigh: Secondary | ICD-10-CM | POA: Insufficient documentation

## 2023-08-08 ENCOUNTER — Encounter: Payer: Self-pay | Admitting: Family

## 2023-08-08 DIAGNOSIS — M810 Age-related osteoporosis without current pathological fracture: Secondary | ICD-10-CM

## 2023-08-08 DIAGNOSIS — M85851 Other specified disorders of bone density and structure, right thigh: Secondary | ICD-10-CM

## 2023-08-12 MED ORDER — ALENDRONATE SODIUM 70 MG PO TABS: 70.0000 mg | ORAL_TABLET | ORAL | 11 refills | Status: DC

## 2023-08-22 ENCOUNTER — Ambulatory Visit (HOSPITAL_BASED_OUTPATIENT_CLINIC_OR_DEPARTMENT_OTHER)
Admission: RE | Admit: 2023-08-22 | Discharge: 2023-08-22 | Disposition: A | Discharge status: Home / Self Care | Payer: Medicare Other | Source: Ambulatory Visit | Attending: Family | Admitting: Family

## 2023-08-22 DIAGNOSIS — Z8249 Family history of ischemic heart disease and other diseases of the circulatory system: Secondary | ICD-10-CM | POA: Insufficient documentation

## 2023-08-22 DIAGNOSIS — M79601 Pain in right arm: Secondary | ICD-10-CM | POA: Insufficient documentation

## 2023-09-11 ENCOUNTER — Other Ambulatory Visit (HOSPITAL_BASED_OUTPATIENT_CLINIC_OR_DEPARTMENT_OTHER): Payer: Self-pay | Admitting: Family

## 2023-09-11 DIAGNOSIS — R931 Abnormal findings on diagnostic imaging of heart and coronary circulation: Secondary | ICD-10-CM

## 2023-09-11 DIAGNOSIS — Z8249 Family history of ischemic heart disease and other diseases of the circulatory system: Secondary | ICD-10-CM

## 2023-09-11 DIAGNOSIS — E782 Mixed hyperlipidemia: Secondary | ICD-10-CM

## 2023-10-24 ENCOUNTER — Encounter: Payer: Self-pay | Admitting: Gastroenterology

## 2023-11-08 ENCOUNTER — Ambulatory Visit: Payer: Medicare Other | Admitting: Family

## 2023-11-08 ENCOUNTER — Telehealth: Payer: Self-pay

## 2023-11-08 ENCOUNTER — Encounter: Payer: Self-pay | Admitting: Family

## 2023-11-08 VITALS — BP 116/75 | HR 72 | Temp 97.8°F | Ht 62.0 in | Wt 120.6 lb

## 2023-11-08 DIAGNOSIS — R590 Localized enlarged lymph nodes: Secondary | ICD-10-CM | POA: Insufficient documentation

## 2023-11-08 DIAGNOSIS — Z807 Family history of other malignant neoplasms of lymphoid, hematopoietic and related tissues: Secondary | ICD-10-CM | POA: Insufficient documentation

## 2023-11-08 DIAGNOSIS — M545 Low back pain, unspecified: Secondary | ICD-10-CM

## 2023-11-08 DIAGNOSIS — L739 Follicular disorder, unspecified: Secondary | ICD-10-CM | POA: Diagnosis not present

## 2023-11-08 DIAGNOSIS — R1031 Right lower quadrant pain: Secondary | ICD-10-CM

## 2023-11-08 LAB — POCT URINALYSIS DIPSTICK
Bilirubin, UA: NEGATIVE
Blood, UA: NEGATIVE
Glucose, UA: NEGATIVE
Ketones, UA: NEGATIVE
Leukocytes, UA: NEGATIVE
Nitrite, UA: NEGATIVE
Protein, UA: NEGATIVE
Spec Grav, UA: 1.015 (ref 1.010–1.025)
Urobilinogen, UA: 0.2 U/dL
pH, UA: 7.5 (ref 5.0–8.0)

## 2023-11-08 NOTE — Progress Notes (Signed)
Patient ID: Kelli Mclean, female    DOB: Mar 14, 1956, 67 y.o.   MRN: [id number]  Chief Complaint  Patient presents with   Cyst    Patient states Cyst/lump right above vagina  Pain in lower back  Swollen in groin area Pain left upper; arm comes and goes   Discussed the use of AI scribe software for clinical note transcription with the patient, who gave verbal consent to proceed.  History of Present Illness   The patient, with a past medical history of uterine cancer, high cholesterol and osteoporosis, presents with multiple concerns. The primary concern is night sweats occurring two to three times a week, which is a new symptom for the patient, and states it occurs only from the waist down, no upper body sweating. The patient also reports left arm pain that mostly occurs during the night. Pt also reports a reoccurring cyst on her pubis area.  The patient has also noticed a lump in the groin area that occasionally swells and then subsides. The patient is concerned about this symptom due to a family history of lymphoma. The patient also mentions a constant discomfort under her belly on her right side, which is always present regardless of activity. The patient describes it as a pressure-like pain.  In addition, the patient reports lower back pain. She reports having on her right side, denies any injury or excessive activity. She denies any urinary sx, but does report having same pain last year when she had a UTI.      Assessment & Plan:     Night Sweats/lymphadenopathy - Recurrent episodes of night sweats that she only notices from her waist down. She also reports an on & off swelling of an inguinal lymph node, but denies any fever, fatigue or weight loss.  No other associated symptoms suggestive of systemic illness. Brother recently dx w/lymphoma. -Plan to conduct further research on potential genetic testing for lymphoma.  Pubic bump - Recurrent small tender bump on upper pubic area  underneath hair, approx. 0.5cm pinkish, slightly raised, appears as folliculitis. Pt reports it has drained in past, crusty, but has not noticed pus. -Continue with daily cleaning using soap and water and mild scrubbing. Consider use of Hibiclens soap w/a loofah sponge for cleaning & prevention.   Abdominal/low back Pain - Chronic discomfort in the lower right abdomen that she describes as a constant pressure, and right lumbar pain which is more recent. No associated urinary symptoms. Reports lymphadenopathy in groin, night sweats. History of diverticulosis and recent Colonoscopy requiring an upcoming repeat colonoscopy. UA negative for UTI. -Ordering Abd/pelvic CT scan to r/o abdominal lymphadenopathy. -Advised patient to mention this discomfort to her GI during upcoming colonoscopy.  Cardiovascular Risk/Hyperlipidemia -  High cholesterol, on statin therapy. High CT score noted previously. -Upcoming cardiology appointment for further evaluation.  General Health Maintenance - -Advise patient to get flu and pneumonia vaccines at the pharmacy.     Subjective:    Outpatient Medications Prior to Visit  Medication Sig Dispense Refill   escitalopram (LEXAPRO) 10 MG tablet Take 1 tablet (10 mg total) by mouth daily. 90 tablet 1   hydrocortisone (ANUSOL-HC) 25 MG suppository Place 1 suppository (25 mg total) rectally 2 (two) times daily. (Patient taking differently: Place 25 mg rectally as needed.) 12 suppository 2   Multiple Vitamins-Minerals (MULTIVITAMIN WITH MINERALS) tablet Take 1 tablet by mouth daily.     rosuvastatin (CRESTOR) 20 MG tablet Take 1 tablet (20 mg total) by mouth every  other day. 90 tablet 1   alendronate (FOSAMAX) 70 MG tablet Take 1 tablet (70 mg total) by mouth every 7 (seven) days. Take with a full glass of water on an empty stomach. (Patient not taking: Reported on 11/08/2023) 4 tablet 11   hydrOXYzine (ATARAX) 10 MG tablet Take 1 tablet (10 mg total) by mouth 3 (three) times  daily as needed for anxiety. (Patient not taking: Reported on 11/08/2023) 30 tablet 2   No facility-administered medications prior to visit.   Past Medical History:  Diagnosis Date   Anxiety    Endometrial cancer, grade I (HCC) 12/04/2010   s/p hysterectomy/BSO/washings;  [facility] and [facility].   Flank pain 03/26/2020   Hyperlipidemia    Pyelonephritis 03/26/2020   Past Surgical History:  Procedure Laterality Date   ABDOMINAL HYSTERECTOMY  12/04/2010   uterine cancer; B oophorectomy.   BREAST DUCTAL SYSTEM EXCISION Right 08/04/2015   Procedure: RIGHT NIPPLE DUCT EXCISION;  Surgeon: Manus Rudd, MD;  Location: [facility] OR;  Service: General;  Laterality: Right;   BREAST EXCISIONAL BIOPSY Right 08/04/2015   duct excision   EXTRACORPOREAL SHOCK WAVE LITHOTRIPSY Left 06/17/2020   Procedure: LEFT EXTRACORPOREAL SHOCK WAVE LITHOTRIPSY (ESWL);  Surgeon: Belva Agee, MD;  Location: [facility];  Service: Urology;  Laterality: Left;   FOOT SURGERY     RT  BONE SPUR   No Known Allergies    Objective:    Physical Exam Vitals and nursing note reviewed.  Constitutional:      Appearance: Normal appearance.  Cardiovascular:     Rate and Rhythm: Normal rate and regular rhythm.  Pulmonary:     Effort: Pulmonary effort is normal.     Breath sounds: Normal breath sounds.  Musculoskeletal:        General: Normal range of motion.       Legs:  Skin:    General: Skin is warm and dry.     Findings: Lesion (pinkish, slightly raised bump on pubis, approx 1cm in diameter with some scaling, no oozing or pus noted) present.  Neurological:     Mental Status: She is alert.  Psychiatric:        Mood and Affect: Mood normal.        Behavior: Behavior normal.    BP 116/75   Pulse 72   Temp 97.8 F (36.6 C) (Temporal)   Ht 5\' 2"  (1.575 m)   Wt 120 lb 9.6 oz (54.7 kg)   SpO2 99%   BMI 22.06 kg/m  Wt Readings from Last 3 Encounters:  11/08/23 120 lb 9.6 oz (54.7 kg)  06/20/23 121 lb 8  oz (55.1 kg)  02/21/23 115 lb 9.6 oz (52.4 kg)   *Extra time (30 min) spent with patient today which consisted of chart review, discussing diagnoses, work up, treatment, answering questions, and documentation.  Dulce Sellar, NP

## 2023-11-08 NOTE — Telephone Encounter (Signed)
I called pt in regards to UA results, LVM.

## 2023-11-23 ENCOUNTER — Encounter: Payer: Medicare Other | Admitting: Gastroenterology

## 2023-12-04 ENCOUNTER — Ambulatory Visit (AMBULATORY_SURGERY_CENTER): Payer: Medicare Other | Admitting: *Deleted

## 2023-12-04 VITALS — Ht 62.0 in | Wt 120.0 lb

## 2023-12-04 DIAGNOSIS — Z860100 Personal history of colon polyps, unspecified: Secondary | ICD-10-CM

## 2023-12-04 MED ORDER — PEG 3350-KCL-NA BICARB-NACL 420 G PO SOLR: 4000.0000 mL | Freq: Once | ORAL | 0 refills | Status: AC

## 2023-12-04 NOTE — Progress Notes (Signed)
 Pt's name and DOB verified at the beginning of the pre-visit wit 2 identifiers  Pt denies any difficulty with ambulating,sitting, laying down or rolling side to side  Pt has no issues with ambulation   Pt has no issues moving head neck or swallowing  No egg or soy allergy known to patient   No issues known to pt with past sedation with any surgeries or procedures  Pt denies having issues being intubated  No FH of Malignant Hyperthermia  Pt is not on diet pills or shots  Pt is not on home 02   Pt is not on blood thinners   Pt denies issues with constipation   Pt is not on dialysis  Pt denise any abnormal heart rhythms   Pt denies any upcoming cardiac testing  Pt encouraged to use to use Singlecare or Goodrx to reduce cost   Patient's chart reviewed by Norleen Schillings CNRA prior to pre-visit and patient appropriate for the [facility].  Pre-visit completed and red dot placed by patient's name on their procedure day (on provider's schedule).  .  Visit by phone  Pt states weight is 120 lb  Instructed pt why it is important to and  to call if they have any changes in health or new medications. Directed them to the # given and on instructions.     Instructions reviewed. Pt given both [facility] main # and MD on call # prior to instructions.  Pt states understanding. Instructed to review again prior to procedure. Pt states they will.   Instructions sent by mail with coupon and by My Chart

## 2023-12-10 ENCOUNTER — Ambulatory Visit (HOSPITAL_COMMUNITY): Payer: Medicare Other

## 2023-12-13 ENCOUNTER — Encounter: Payer: Self-pay | Admitting: Internal Medicine

## 2023-12-13 ENCOUNTER — Ambulatory Visit (AMBULATORY_SURGERY_CENTER): Payer: Medicare Other | Admitting: Internal Medicine

## 2023-12-13 ENCOUNTER — Telehealth: Payer: Self-pay | Admitting: *Deleted

## 2023-12-13 VITALS — BP 138/75 | HR 73 | Temp 97.3°F | Resp 14 | Ht 62.0 in | Wt 120.0 lb

## 2023-12-13 DIAGNOSIS — D122 Benign neoplasm of ascending colon: Secondary | ICD-10-CM

## 2023-12-13 DIAGNOSIS — K649 Unspecified hemorrhoids: Secondary | ICD-10-CM

## 2023-12-13 DIAGNOSIS — Z860100 Personal history of colon polyps, unspecified: Secondary | ICD-10-CM

## 2023-12-13 DIAGNOSIS — D123 Benign neoplasm of transverse colon: Secondary | ICD-10-CM | POA: Diagnosis not present

## 2023-12-13 DIAGNOSIS — Z860101 Personal history of adenomatous and serrated colon polyps: Secondary | ICD-10-CM

## 2023-12-13 DIAGNOSIS — K5730 Diverticulosis of large intestine without perforation or abscess without bleeding: Secondary | ICD-10-CM

## 2023-12-13 DIAGNOSIS — D120 Benign neoplasm of cecum: Secondary | ICD-10-CM

## 2023-12-13 DIAGNOSIS — Z1211 Encounter for screening for malignant neoplasm of colon: Secondary | ICD-10-CM | POA: Diagnosis not present

## 2023-12-13 DIAGNOSIS — K635 Polyp of colon: Secondary | ICD-10-CM | POA: Diagnosis not present

## 2023-12-13 DIAGNOSIS — Z9889 Other specified postprocedural states: Secondary | ICD-10-CM

## 2023-12-13 DIAGNOSIS — K648 Other hemorrhoids: Secondary | ICD-10-CM

## 2023-12-13 MED ORDER — SODIUM CHLORIDE 0.9 % IV SOLN: 500.0000 mL | INTRAVENOUS | Status: DC

## 2023-12-13 NOTE — Op Note (Signed)
 [facility] Patient Name: Kelli Mclean Procedure Date: 12/13/2023 4:44 PM MRN: [id number] Endoscopist: Rosario Estefana Kidney , , [id number] Age: 68 Referring MD:  Date of Birth: 02-16-1956 Gender: Female Account #: [id number] Procedure:                Colonoscopy Indications:              High risk colon cancer surveillance: Personal                            history of colonic polyps Medicines:                Monitored Anesthesia Care Procedure:                Pre-Anesthesia Assessment:                           - Prior to the procedure, a History and Physical                            was performed, and patient medications and                            allergies were reviewed. The patient's tolerance of                            previous anesthesia was also reviewed. The risks                            and benefits of the procedure and the sedation                            options and risks were discussed with the patient.                            All questions were answered, and informed consent                            was obtained. Prior Anticoagulants: The patient has                            taken no anticoagulant or antiplatelet agents. ASA                            Grade Assessment: II - A patient with mild systemic                            disease. After reviewing the risks and benefits,                            the patient was deemed in satisfactory condition to                            undergo the procedure.  After obtaining informed consent, the colonoscope                            was passed under direct vision. Throughout the                            procedure, the patient's blood pressure, pulse, and                            oxygen saturations were monitored continuously. The                            CF [id number] #[id number] was introduced through the anus                            and advanced to the the  terminal ileum. The                            colonoscopy was performed without difficulty. The                            patient tolerated the procedure well. The quality                            of the bowel preparation was good. The terminal                            ileum, ileocecal valve, appendiceal orifice, and                            rectum were photographed. Scope In: 5:00:16 PM Scope Out: 5:25:47 PM Scope Withdrawal Time: 0 hours 18 minutes 6 seconds  Total Procedure Duration: 0 hours 25 minutes 31 seconds  Findings:                 The terminal ileum appeared normal.                           Eight sessile polyps were found in the transverse                            colon, ascending colon and cecum. The polyps were 3                            to 7 mm in size. These polyps were removed with a                            cold snare. Resection and retrieval were complete.                           A tattoo was seen in the ascending colon.                           A 2 mm polyp was  found in the ascending colon. The                            polyp was sessile. The polyp was removed with a                            cold biopsy forceps. Resection and retrieval were                            complete.                           Multiple diverticula were found in the sigmoid                            colon and descending colon.                           Non-bleeding internal hemorrhoids were found during                            retroflexion. Complications:            No immediate complications. Estimated Blood Loss:     Estimated blood loss was minimal. Impression:               - The examined portion of the ileum was normal.                           - Eight 3 to 7 mm polyps in the transverse colon,                            in the ascending colon and in the cecum, removed                            with a cold snare. Resected and retrieved.                            - A tattoo was seen in the ascending colon.                           - One 2 mm polyp in the ascending colon, removed                            with a cold biopsy forceps. Resected and retrieved.                           - Diverticulosis in the sigmoid colon and in the                            descending colon.                           - Non-bleeding internal hemorrhoids. Recommendation:           - Discharge  patient to home (with escort).                           - Await pathology results.                           - The findings and recommendations were discussed                            with the patient. Dr Estefana Federico Rosario Estefana Federico,  12/13/2023 5:30:52 PM

## 2023-12-13 NOTE — Patient Instructions (Signed)
 Resume previous diet and medications.  Follow up colonoscopy to be based on biopsy results.  Handout provided on polyps.    YOU HAD AN ENDOSCOPIC PROCEDURE TODAY AT THE [facility]:   Refer to the procedure report that was given to you for any specific questions about what was found during the examination.  If the procedure report does not answer your questions, please call your gastroenterologist to clarify.  If you requested that your care partner not be given the details of your procedure findings, then the procedure report has been included in a sealed envelope for you to review at your convenience later.  YOU SHOULD EXPECT: Some feelings of bloating in the abdomen. Passage of more gas than usual.  Walking can help get rid of the air that was put into your GI tract during the procedure and reduce the bloating. If you had a lower endoscopy (such as a colonoscopy or flexible sigmoidoscopy) you may notice spotting of blood in your stool or on the toilet paper. If you underwent a bowel prep for your procedure, you may not have a normal bowel movement for a few days.  Please Note:  You might notice some irritation and congestion in your nose or some drainage.  This is from the oxygen used during your procedure.  There is no need for concern and it should clear up in a day or so.  SYMPTOMS TO REPORT IMMEDIATELY:  Following lower endoscopy (colonoscopy or flexible sigmoidoscopy):  Excessive amounts of blood in the stool  Significant tenderness or worsening of abdominal pains  Swelling of the abdomen that is new, acute  Fever of 100F or higher  For urgent or emergent issues, a gastroenterologist can be reached at any hour by calling [phone]. Do not use MyChart messaging for urgent concerns.    DIET:  We do recommend a small meal at first, but then you may proceed to your regular diet.  Drink plenty of fluids but you should avoid alcoholic beverages for 24 hours.  ACTIVITY:  You  should plan to take it easy for the rest of today and you should NOT DRIVE or use heavy machinery until tomorrow (because of the sedation medicines used during the test).    FOLLOW UP: Our staff will call the number listed on your records the next business day following your procedure.  We will call around 7:15- 8:00 am to check on you and address any questions or concerns that you may have regarding the information given to you following your procedure. If we do not reach you, we will leave a message.     If any biopsies were taken you will be contacted by phone or by letter within the next 1-3 weeks.  Please call us  at [phone] if you have not heard about the biopsies in 3 weeks.    SIGNATURES/CONFIDENTIALITY: You and/or your care partner have signed paperwork which will be entered into your electronic medical record.  These signatures attest to the fact that that the information above on your After Visit Summary has been reviewed and is understood.  Full responsibility of the confidentiality of this discharge information lies with you and/or your care-partner.

## 2023-12-13 NOTE — Progress Notes (Signed)
 A/O x 3, gd SR's, VSS, report to RN

## 2023-12-13 NOTE — Progress Notes (Signed)
 Pt's states no medical or surgical changes since previsit or office visit.

## 2023-12-13 NOTE — Telephone Encounter (Signed)
 Dr. Leonides Schanz,  This patient asked in recovery if she can have an RX for suppositories for hemorrhoids.  Please advise.

## 2023-12-13 NOTE — Progress Notes (Signed)
 GASTROENTEROLOGY PROCEDURE H&P NOTE   Primary Care Physician: Lucius Krabbe, NP    Reason for Procedure:   History of colon polyps  Plan:    Colonoscopy  Patient is appropriate for endoscopic procedure(s) in the ambulatory ([facility]) setting.  The nature of the procedure, as well as the risks, benefits, and alternatives were carefully and thoroughly reviewed with the patient. Ample time for discussion and questions allowed. The patient understood, was satisfied, and agreed to proceed.     HPI: Kelli Mclean is a 68 y.o. female who presents for colonoscopy for history of colon polyps. Denies blood in stools, changes in bowel habits, or unintentional weight loss. Denies family history of colon cancer.  Colonoscopy 02/21/23:  - Non- bleeding external and internal hemorrhoids. - Diverticulosis in the sigmoid colon and in the descending colon. - One 5 mm polyp in the sigmoid colon, removed with a cold snare. Resected and retrieved. - One 3 mm polyp in the ascending colon, removed with a cold snare. Resected and retrieved. - One 5 mm polyp in the cecum, removed with a cold snare. Resected and retrieved. - One 14 mm polyp in the proximal ascending colon, removed with a cold snare. Resected and retrieved. Tattooed. - One 3 mm polyp in the ascending colon, removed with a cold snare. Resected and retrieved. - One 20 mm polyp in the mid ascending colon, removed with mucosal resection. Resected and retrieved. - The examination was otherwise normal on direct and retroflexion views. - Mucosal resection was performed. Resection and retrieval were complete. Path: 1. Surgical [P], colon, sigmoid, polyp (1) - TUBULAR ADENOMA - NEGATIVE FOR HIGH-GRADE DYSPLASIA OR MALIGNANCY 2. Surgical [P], colon, ascending, polyp (1) - TUBULAR ADENOMA - NEGATIVE FOR HIGH-GRADE DYSPLASIA OR MALIGNANCY 3. Surgical [P], colon, cecum, polyp (1) - TUBULAR ADENOMA - NEGATIVE FOR HIGH-GRADE DYSPLASIA OR MALIGNANCY 4.  Surgical [P], colon, ascending, polyp (1) - TUBULAR ADENOMA WITH FOCAL HIGH-GRADE DYSPLASIA - NEGATIVE FOR INVASIVE CARCINOMA - SEE NOTE 5. Surgical [P], colon, ascending, polyp (2) - TUBULAR ADENOMA(S) (MULTIPLE FRAGMENTS) - NEGATIVE FOR HIGH-GRADE DYSPLASIA OR MALIGNANCY 6. Surgical [P], colon, ascending, transverse, polyp (2) - TUBULAR ADENOMA (1 FRAGMENT) - POLYPOID COLONIC MUCOSA WITH FOCAL SURFACE HYPERPLASTIC CHANGE (1 FRAGMENT) - NEGATIVE FOR HIGH-GRADE DYSPLASIA OR MALIGNANCY   Past Medical History:  Diagnosis Date   Anxiety    Endometrial cancer, grade I (HCC) 12/04/2010   s/p hysterectomy/BSO/washings;  [facility] and [facility].   Flank pain 03/26/2020   Hyperlipidemia    Osteoporosis    Pyelonephritis 03/26/2020    Past Surgical History:  Procedure Laterality Date   ABDOMINAL HYSTERECTOMY  12/04/2010   uterine cancer; B oophorectomy.   BREAST DUCTAL SYSTEM EXCISION Right 08/04/2015   Procedure: RIGHT NIPPLE DUCT EXCISION;  Surgeon: Donnice Lima, MD;  Location: [facility];  Service: General;  Laterality: Right;   BREAST EXCISIONAL BIOPSY Right 08/04/2015   duct excision   EXTRACORPOREAL SHOCK WAVE LITHOTRIPSY Left 06/17/2020   Procedure: LEFT EXTRACORPOREAL SHOCK WAVE LITHOTRIPSY (ESWL);  Surgeon: Rosalind Zachary NOVAK, MD;  Location: [facility];  Service: Urology;  Laterality: Left;   FOOT SURGERY     RT  BONE SPUR    Prior to Admission medications   Medication Sig Start Date End Date Taking? Authorizing Provider  escitalopram  (LEXAPRO ) 10 MG tablet Take 1 tablet (10 mg total) by mouth daily. 06/20/23  Yes Lucius Krabbe, NP  alendronate  (FOSAMAX ) 70 MG tablet Take 1 tablet (70 mg total) by mouth  every 7 (seven) days. Take with a full glass of water  on an empty stomach. 08/12/23   Lucius Krabbe, NP  B Complex Vitamins (B-COMPLEX/B-12) TABS Take by mouth.    [provider]  hydrocortisone  (ANUSOL -HC) 25 MG suppository Place 1 suppository (25 mg total)  rectally 2 (two) times daily. Patient taking differently: Place 25 mg rectally as needed. 05/29/19   Melonie Colonel, Mikel HERO, MD  hydrOXYzine  (ATARAX ) 10 MG tablet Take 1 tablet (10 mg total) by mouth 3 (three) times daily as needed for anxiety. Patient not taking: Reported on 11/08/2023 06/20/23   Lucius Krabbe, NP  Multiple Vitamins-Minerals (MULTIVITAMIN WITH MINERALS) tablet Take 1 tablet by mouth daily.    [provider]  rosuvastatin  (CRESTOR ) 20 MG tablet Take 1 tablet (20 mg total) by mouth every other day. 06/20/23   Lucius Krabbe, NP    Current Outpatient Medications  Medication Sig Dispense Refill   escitalopram  (LEXAPRO ) 10 MG tablet Take 1 tablet (10 mg total) by mouth daily. 90 tablet 1   alendronate  (FOSAMAX ) 70 MG tablet Take 1 tablet (70 mg total) by mouth every 7 (seven) days. Take with a full glass of water  on an empty stomach. 4 tablet 11   B Complex Vitamins (B-COMPLEX/B-12) TABS Take by mouth.     hydrocortisone  (ANUSOL -HC) 25 MG suppository Place 1 suppository (25 mg total) rectally 2 (two) times daily. (Patient taking differently: Place 25 mg rectally as needed.) 12 suppository 2   hydrOXYzine  (ATARAX ) 10 MG tablet Take 1 tablet (10 mg total) by mouth 3 (three) times daily as needed for anxiety. (Patient not taking: Reported on 11/08/2023) 30 tablet 2   Multiple Vitamins-Minerals (MULTIVITAMIN WITH MINERALS) tablet Take 1 tablet by mouth daily.     rosuvastatin  (CRESTOR ) 20 MG tablet Take 1 tablet (20 mg total) by mouth every other day. 90 tablet 1   Current Facility-Administered Medications  Medication Dose Route Frequency Provider Last Rate Last Admin   0.9 %  sodium chloride  infusion  500 mL Intravenous Continuous Federico Rosario BROCKS, MD        Allergies as of 12/13/2023   (No Known Allergies)    Family History  Problem Relation Age of Onset   Breast cancer Mother 12   Heart disease Mother 83       CABG/CAD/AMI   Hyperlipidemia Mother    Heart  failure Father    Dementia Father    Heart disease Maternal Grandfather    Heart disease Paternal Grandfather    Diabetes Brother    Hyperlipidemia Brother    Heart disease Brother 7       AMI x 2 with stenting   Heart disease Brother    Hyperlipidemia Brother    Colon cancer Neg Hx    Colon polyps Neg Hx    Esophageal cancer Neg Hx    Rectal cancer Neg Hx    Stomach cancer Neg Hx     Social History   Socioeconomic History   Marital status: Married    Spouse name: Not on file   Number of children: 3   Years of education: Not on file   Highest education level: Not on file  Occupational History   Occupation: FLORIST  Tobacco Use   Smoking status: Never   Smokeless tobacco: Never  Vaping Use   Vaping status: Never Used  Substance and Sexual Activity   Alcohol use: No    Alcohol/week: 0.0 standard drinks of alcohol   Drug use: No  Sexual activity: Yes    Birth control/protection: Post-menopausal, Surgical  Other Topics Concern   Not on file  Social History Narrative   Marital status:  Married x 35 years; happily married.      Children: 3 children; no grandchildren.      Lives: with husband.        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        Tobacco:  None      Alcohol: none      Exercise: three days per week ;[employer].  [place].         Social Drivers of Corporate Investment Banker Strain: Low Risk  (11/21/2022)   Overall Financial Resource Strain (CARDIA)    Difficulty of Paying Living Expenses: Not hard at all  Food Insecurity: No Food Insecurity (11/21/2022)   Hunger Vital Sign    Worried About Running Out of Food in the Last Year: Never true    Ran Out of Food in the Last Year: Never true  Transportation Needs: No Transportation Needs (11/21/2022)   PRAPARE - Administrator, Civil Service (Medical): No    Lack of Transportation (Non-Medical): No  Physical Activity: Inactive (11/21/2022)   Exercise Vital Sign     Days of Exercise per Week: 0 days    Minutes of Exercise per Session: 0 min  Stress: No Stress Concern Present (11/21/2022)   [employer] of Occupational Health - Occupational Stress Questionnaire    Feeling of Stress : Not at all  Social Connections: Moderately Integrated (11/21/2022)   Social Connection and Isolation Panel [NHANES]    Frequency of Communication with Friends and Family: More than three times a week    Frequency of Social Gatherings with Friends and Family: More than three times a week    Attends Religious Services: More than 4 times per year    Active Member of [employer] or Organizations: No    Attends Banker Meetings: Never    Marital Status: Married  Catering Manager Violence: Not At Risk (11/21/2022)   Humiliation, Afraid, Rape, and Kick questionnaire    Fear of Current or Ex-Partner: No    Emotionally Abused: No    Physically Abused: No    Sexually Abused: No    Physical Exam: Vital signs in last 24 hours: BP (!) 140/107   Pulse 84   Temp (!) 97.3 F (36.3 C) (Temporal)   Ht 5' 2 (1.575 m)   Wt 120 lb (54.4 kg)   SpO2 99%   BMI 21.95 kg/m  GEN: NAD EYE: Sclerae anicteric ENT: MMM CV: Non-tachycardic Pulm: No increased work of breathing GI: Soft, NT/ND NEURO:  Alert & Oriented   Estefana Kidney, MD [facility]  12/13/2023 3:21 PM

## 2023-12-14 ENCOUNTER — Telehealth: Payer: Self-pay

## 2023-12-14 MED ORDER — HYDROCORTISONE ACETATE 25 MG RE SUPP: 25.0000 mg | Freq: Two times a day (BID) | RECTAL | 0 refills | Status: DC

## 2023-12-14 NOTE — Telephone Encounter (Signed)
  Follow up Call-     12/13/2023    3:04 PM 02/21/2023    9:44 AM  Call back number  Post procedure Call Back phone  # [phone]  Permission to leave phone message Yes Yes     Patient questions:  Do you have a fever, pain , or abdominal swelling? No. Pain Score  0 *  Have you tolerated food without any problems? Yes.    Have you been able to return to your normal activities? Yes.    Do you have any questions about your discharge instructions: Diet   No. Medications  No. Follow up visit  No.  Do you have questions or concerns about your Care? No.  Actions: * If pain score is 4 or above: No action needed, pain <4.

## 2023-12-18 ENCOUNTER — Encounter: Payer: Self-pay | Admitting: Internal Medicine

## 2023-12-18 LAB — SURGICAL PATHOLOGY

## 2023-12-26 ENCOUNTER — Ambulatory Visit: Payer: Medicare Other | Admitting: Family

## 2023-12-26 NOTE — Progress Notes (Deleted)
   Patient ID: Kelli Mclean, female    DOB: 09-10-1956, 68 y.o.   MRN: [id number]  No chief complaint on file.           Assessment & Plan:   Subjective:    Outpatient Medications Prior to Visit  Medication Sig Dispense Refill   alendronate (FOSAMAX) 70 MG tablet Take 1 tablet (70 mg total) by mouth every 7 (seven) days. Take with a full glass of water on an empty stomach. 4 tablet 11   B Complex Vitamins (B-COMPLEX/B-12) TABS Take by mouth.     escitalopram (LEXAPRO) 10 MG tablet Take 1 tablet (10 mg total) by mouth daily. 90 tablet 1   hydrocortisone (ANUSOL-HC) 25 MG suppository Place 1 suppository (25 mg total) rectally 2 (two) times daily. 14 suppository 0   hydrOXYzine (ATARAX) 10 MG tablet Take 1 tablet (10 mg total) by mouth 3 (three) times daily as needed for anxiety. (Patient not taking: Reported on 11/08/2023) 30 tablet 2   Multiple Vitamins-Minerals (MULTIVITAMIN WITH MINERALS) tablet Take 1 tablet by mouth daily.     rosuvastatin (CRESTOR) 20 MG tablet Take 1 tablet (20 mg total) by mouth every other day. 90 tablet 1   No facility-administered medications prior to visit.   Past Medical History:  Diagnosis Date   Anxiety    Endometrial cancer, grade I (HCC) 12/04/2010   s/p hysterectomy/BSO/washings;  [facility] and [facility].   Flank pain 03/26/2020   Hyperlipidemia    Osteoporosis    Pyelonephritis 03/26/2020   Past Surgical History:  Procedure Laterality Date   ABDOMINAL HYSTERECTOMY  12/04/2010   uterine cancer; B oophorectomy.   BREAST DUCTAL SYSTEM EXCISION Right 08/04/2015   Procedure: RIGHT NIPPLE DUCT EXCISION;  Surgeon: Manus Rudd, MD;  Location: [facility] OR;  Service: General;  Laterality: Right;   BREAST EXCISIONAL BIOPSY Right 08/04/2015   duct excision   EXTRACORPOREAL SHOCK WAVE LITHOTRIPSY Left 06/17/2020   Procedure: LEFT EXTRACORPOREAL SHOCK WAVE LITHOTRIPSY (ESWL);  Surgeon: Belva Agee, MD;  Location[facility];  Service: Urology;   Laterality: Left;   FOOT SURGERY     RT  BONE SPUR   No Known Allergies    Objective:    Physical Exam Vitals and nursing note reviewed.  Constitutional:      Appearance: Normal appearance.  Cardiovascular:     Rate and Rhythm: Normal rate and regular rhythm.  Pulmonary:     Effort: Pulmonary effort is normal.     Breath sounds: Normal breath sounds.  Musculoskeletal:        General: Normal range of motion.  Skin:    General: Skin is warm and dry.  Neurological:     Mental Status: She is alert.  Psychiatric:        Mood and Affect: Mood normal.        Behavior: Behavior normal.    There were no vitals taken for this visit. Wt Readings from Last 3 Encounters:  12/13/23 120 lb (54.4 kg)  12/04/23 120 lb (54.4 kg)  11/08/23 120 lb 9.6 oz (54.7 kg)       Dulce Sellar, NP

## 2024-01-09 ENCOUNTER — Ambulatory Visit: Payer: Medicare Other | Attending: Cardiology | Admitting: Cardiology

## 2024-01-09 ENCOUNTER — Encounter: Payer: Self-pay | Admitting: Cardiology

## 2024-01-09 VITALS — BP 122/78 | HR 69 | Resp 16 | Wt 120.0 lb

## 2024-01-09 DIAGNOSIS — R911 Solitary pulmonary nodule: Secondary | ICD-10-CM | POA: Diagnosis not present

## 2024-01-09 DIAGNOSIS — E7800 Pure hypercholesterolemia, unspecified: Secondary | ICD-10-CM | POA: Diagnosis not present

## 2024-01-09 DIAGNOSIS — R931 Abnormal findings on diagnostic imaging of heart and coronary circulation: Secondary | ICD-10-CM

## 2024-01-09 NOTE — Patient Instructions (Signed)

## 2024-01-09 NOTE — Progress Notes (Signed)
 Cardiology Office Note:  .   Date:  01/09/2024  ID:  Kelli Mclean, DOB February 15, 1956, MRN [id number] PCP: Lucius Krabbe, NP  [facility] Providers Cardiologist:  Gordy Bergamo, MD   History of Present Illness: .   Kelli Mclean is a 68 y.o. Caucasian female patient with hypercholesterolemia, generalized anxiety disorder referred to us  for evaluation of abnormal coronary calcium  score performed on 09/09/2023 revealing total score of 94.6 and 75th percentile.  Discussed the use of AI scribe software for clinical note transcription with the patient, who gave verbal consent to proceed.  History of Present Illness   Kelli Mclean is a 68 year old female who presents with a high coronary calcium  score. She was referred by her family doctor for evaluation of her high coronary calcium  score.  She has been on Crestor  for cholesterol management for approximately ten years, currently taking 20 mg every other day due to previous fatigue when taken daily.  There is no family history of premature heart disease in family however her mother having had open heart surgery in her seventies and a brother who had a stent placed in his sixties. Her grandfather on her mother's side had multiple heart attacks. No family history of heart attacks before the age of 79.  She leads an active lifestyle, working in a [employer], and is responsible for caring for her husband with dementia and her 57 year old mother. She has three children and four grandchildren under the age of 100, which keeps her busy. She reports sleeping well and does not engage in regular exercise currently, although she used to go to the gym before the COVID pandemic.     Labs   Lab Results  Component Value Date   CHOL 158 12/20/2022   HDL 57.40 12/20/2022   LDLCALC 83 12/20/2022   TRIG 91.0 12/20/2022   CHOLHDL 3 12/20/2022   Lab Results  Component Value Date   NA 139 02/25/2023   K 3.8 02/25/2023   CO2 30  02/25/2023   GLUCOSE 96 02/25/2023   BUN 12 02/25/2023   CREATININE 0.70 02/25/2023   CALCIUM  9.4 02/25/2023   GFRNONAA >60 02/25/2023      Latest Ref Rng & Units 02/25/2023    1:45 PM 08/18/2022    3:38 PM 04/30/2020   11:17 AM  BMP  Glucose 70 - 99 mg/dL 96  95  90   BUN 8 - 23 mg/dL 12  12  19    Creatinine 0.44 - 1.00 mg/dL 9.29  9.31  9.07   BUN/Creat Ratio 6 - 22 (calc)  SEE NOTE:  21   Sodium 135 - 145 mmol/L 139  144  138   Potassium 3.5 - 5.1 mmol/L 3.8  4.3  4.4   Chloride 98 - 111 mmol/L 102  106  101   CO2 22 - 32 mmol/L 30  26  21    Calcium  8.9 - 10.3 mg/dL 9.4  9.5  9.2       Latest Ref Rng & Units 02/25/2023    1:45 PM 04/30/2020   11:17 AM 05/13/2018    6:29 PM  CBC  WBC 4.0 - 10.5 K/uL 6.1  5.1  WILL FOLLOW  P  Hemoglobin 12.0 - 15.0 g/dL 85.8  86.8  WILL FOLLOW  P  Hematocrit 36.0 - 46.0 % 41.9  38.9  WILL FOLLOW  P  Platelets 150 - 400 K/uL 213  242  WILL FOLLOW  P  P Preliminary result   Review of Systems  Cardiovascular:  Negative for chest pain, dyspnea on exertion and leg swelling.    Physical Exam:   VS:  BP 122/78 (BP Location: Left Arm, Patient Position: Sitting, Cuff Size: Normal)   Pulse 69   Resp 16   Wt 120 lb (54.4 kg)   SpO2 98%   BMI 21.95 kg/m    Wt Readings from Last 3 Encounters:  01/09/24 120 lb (54.4 kg)  12/13/23 120 lb (54.4 kg)  12/04/23 120 lb (54.4 kg)    Physical Exam Neck:     Vascular: No carotid bruit or JVD.  Cardiovascular:     Rate and Rhythm: Normal rate and regular rhythm.     Pulses: Intact distal pulses.     Heart sounds: Normal heart sounds. No murmur heard.    No gallop.  Pulmonary:     Effort: Pulmonary effort is normal.     Breath sounds: Normal breath sounds.  Abdominal:     General: Bowel sounds are normal.     Palpations: Abdomen is soft.  Musculoskeletal:     Right lower leg: No edema.     Left lower leg: No edema.    Studies Reviewed: .    CT CARDIAC SCORING (SELF PAY ONLY) 08/22/2023   Left main: 0  Left anterior descending artery: 53.1  Left circumflex artery: 0  Right coronary artery: 41.5  Total: 94.6 Percentile: 75th  Pericardium: Normal.  Ascending Aorta: Normal caliber.  Right middle lobe 9 mm nodule. Noncontrast CT chest recommended to evaluate for additional lesions.  EKG:    EKG Interpretation Date/Time:  Wednesday January 09 2024 10:54:22 EST Ventricular Rate:  65 PR Interval:  138 QRS Duration:  76 QT Interval:  418 QTC Calculation: 434 R Axis:   46  Text Interpretation: EKG 01/09/2024: Normal sinus rhythm at rate of 65 bpm.  No change from 09/25/2011. Confirmed by Tieasha Larsen, Jagadeesh ([id number]) on 01/09/2024 10:59:46 AM    Medications and allergies    No Known Allergies   Current Outpatient Medications:    B Complex Vitamins (B-COMPLEX/B-12) TABS, Take by mouth., Disp: , Rfl:    escitalopram  (LEXAPRO ) 10 MG tablet, Take 1 tablet (10 mg total) by mouth daily., Disp: 90 tablet, Rfl: 1   Multiple Vitamins-Minerals (MULTIVITAMIN WITH MINERALS) tablet, Take 1 tablet by mouth daily., Disp: , Rfl:    rosuvastatin  (CRESTOR ) 20 MG tablet, Take 1 tablet (20 mg total) by mouth every other day., Disp: 90 tablet, Rfl: 1   hydrOXYzine  (ATARAX ) 10 MG tablet, Take 1 tablet (10 mg total) by mouth 3 (three) times daily as needed for anxiety. (Patient not taking: Reported on 01/09/2024), Disp: 30 tablet, Rfl: 2   ASSESSMENT AND PLAN: .      ICD-10-CM   1. Elevated coronary artery calcium  score: 09/09/2023 revealing total score of 94.6 and 75th MESA  percentile  R93.1 EKG 12-Lead    2. Hypercholesteremia  E78.00     3. Lung nodule  R91.1      Assessment and Plan    Coronary Artery Disease High coronary calcium  score indicating plaque buildup. Asymptomatic with normal blood pressure, physical exam, and EKG. LDL cholesterol is 83 mg/dL. She is in the 75th percentile for plaque buildup. Goal is to reduce LDL to less than 70 mg/dL to manage risk factors and prevent  progression. - Increase Crestor  (rosuvastatin ) to 20 mg daily - Recheck cholesterol levels in 6 months - Consider adding Zetia (ezetimibe)  if LDL remains elevated or if she experiences side effects from Crestor   Hyperlipidemia On Crestor  for 10 years, currently taking 20 mg every other day due to fatigue. LDL is 83 mg/dL. Goal is to reduce LDL to less than 70 mg/dL. Consider taking Crestor  every other day and adding Zetia if side effects occur. Combination of Crestor  and Zetia can significantly lower LDL levels. - Increase Crestor  (rosuvastatin ) to 20 mg daily - Monitor for side effects such as muscle aches and joint pains - Recheck cholesterol levels in 6 months - Consider adding Zetia (ezetimibe) if needed  Lung Nodule Incidental 9 mm nodule in the right middle lobe on noncontrast CT scan. Nonsmoker with no symptoms. Differential diagnosis includes benign nodule versus other etiologies. Nodule does not appear to be a lymph node and requires follow-up. - Follow up with primary care physician for further evaluation and monitoring  Osteoporosis Osteoporosis acknowledged, need for strength training regimen discussed. - Encourage initiation of a strength training program - Follow up with primary care physician for further management  Anxiety Anxiety may be contributing to overall fatigue and stress levels. - Continue current management - Consider discussing further management options with primary care physician if symptoms persist  General Health Maintenance Active in work and personal life but unable to maintain regular exercise routine since COVID-19. Does not smoke or drink alcohol. - Encourage regular physical activity and strength training - Continue wearing compression stockings to manage spider veins  Follow-up - Follow up with primary care physician in 6 months for cholesterol recheck - Follow up with primary care physician for lung nodule evaluation.  - An incidental finding  of a small nodule in her right middle lobe was noted on a CT scan, but she was not previously informed about this finding. Her brother was recently diagnosed with lymphoma, which was found in his lungs.   Signed,  Gordy Bergamo, MD, [facility] 01/09/2024, 11:41 AM [facility] [address] [place], [place] [address] Phone: [phone]. Fax:  [phone]

## 2024-01-30 ENCOUNTER — Ambulatory Visit (INDEPENDENT_AMBULATORY_CARE_PROVIDER_SITE_OTHER): Payer: Medicare Other

## 2024-01-30 VITALS — Wt 120.0 lb

## 2024-01-30 DIAGNOSIS — Z0000 Encounter for general adult medical examination without abnormal findings: Secondary | ICD-10-CM

## 2024-01-30 NOTE — Progress Notes (Signed)
 Subjective:   Kelli Mclean is a 68 y.o. who presents for a Medicare Wellness preventive visit.  Visit Complete: Virtual I connected with  Kelli Mclean on 01/30/24 by a video and audio enabled telemedicine application and verified that I am speaking with the correct person using two identifiers.  Patient Location: Home  Provider Location: Home Office  I discussed the limitations of evaluation and management by telemedicine. The patient expressed understanding and agreed to proceed.  Vital Signs: Because this visit was a virtual/telehealth visit, some criteria may be missing or patient reported. Any vitals not documented were not able to be obtained and vitals that have been documented are patient reported.    AWV Questionnaire: Yes: Patient Medicare AWV questionnaire was completed by the patient on 01/29/24; I have confirmed that all information answered by patient is correct and no changes since this date.  Cardiac Risk Factors include: advanced age (>68men, >32 women);dyslipidemia     Objective:    Today's Vitals   01/30/24 1448  Weight: 120 lb (54.4 kg)   Body mass index is 21.95 kg/m.     01/30/2024    2:56 PM 02/25/2023    1:30 PM 11/21/2022    3:07 PM 06/17/2020   12:21 PM 07/28/2015   11:36 AM 01/26/2015    8:06 AM 01/19/2015   10:30 AM  Advanced Directives  Does Patient Have a Medical Advance Directive? No No No No No No No  Would patient like information on creating a medical advance directive? No - Patient declined No - Patient declined No - Patient declined No - Patient declined No - patient declined information No - patient declined information No - patient declined information    Current Medications (verified) Outpatient Encounter Medications as of 01/30/2024  Medication Sig   B Complex Vitamins (B-COMPLEX/B-12) TABS Take by mouth.   escitalopram (LEXAPRO) 10 MG tablet Take 1 tablet (10 mg total) by mouth daily.   Multiple Vitamins-Minerals  (MULTIVITAMIN WITH MINERALS) tablet Take 1 tablet by mouth daily.   rosuvastatin (CRESTOR) 20 MG tablet Take 1 tablet (20 mg total) by mouth every other day.   [DISCONTINUED] hydrOXYzine (ATARAX) 10 MG tablet Take 1 tablet (10 mg total) by mouth 3 (three) times daily as needed for anxiety.   No facility-administered encounter medications on file as of 01/30/2024.    Allergies (verified) Patient has no known allergies.   History: Past Medical History:  Diagnosis Date   Anxiety    Endometrial cancer, grade I (HCC) 12/04/2010   s/p hysterectomy/BSO/washings;  [facility] and [facility].   Flank pain 03/26/2020   Hyperlipidemia    Osteoporosis    Pyelonephritis 03/26/2020   Past Surgical History:  Procedure Laterality Date   ABDOMINAL HYSTERECTOMY  12/04/2010   uterine cancer; B oophorectomy.   BREAST DUCTAL SYSTEM EXCISION Right 08/04/2015   Procedure: RIGHT NIPPLE DUCT EXCISION;  Surgeon: Manus Rudd, MD;  Location: [facility] OR;  Service: General;  Laterality: Right;   BREAST EXCISIONAL BIOPSY Right 08/04/2015   duct excision   EXTRACORPOREAL SHOCK WAVE LITHOTRIPSY Left 06/17/2020   Procedure: LEFT EXTRACORPOREAL SHOCK WAVE LITHOTRIPSY (ESWL);  Surgeon: Belva Agee, MD;  Location: [facility];  Service: Urology;  Laterality: Left;   FOOT SURGERY     RT  BONE SPUR   Family History  Problem Relation Age of Onset   Breast cancer Mother 80   Heart disease Mother 10       CABG/CAD/AMI   Hyperlipidemia Mother  Heart failure Father    Dementia Father    Heart disease Maternal Grandfather    Heart disease Paternal Grandfather    Diabetes Brother    Hyperlipidemia Brother    Heart disease Brother 28       AMI x 2 with stenting   Heart disease Brother    Hyperlipidemia Brother    Colon cancer Neg Hx    Colon polyps Neg Hx    Esophageal cancer Neg Hx    Rectal cancer Neg Hx    Stomach cancer Neg Hx    Social History   Socioeconomic History   Marital status: Married     Spouse name: Not on file   Number of children: 3   Years of education: Not on file   Highest education level: 12th grade  Occupational History   Occupation: FLORIST  Tobacco Use   Smoking status: Never   Smokeless tobacco: Never  Vaping Use   Vaping status: Never Used  Substance and Sexual Activity   Alcohol use: No    Alcohol/week: 0.0 standard drinks of alcohol   Drug use: No   Sexual activity: Yes    Birth control/protection: Post-menopausal, Surgical  Other Topics Concern   Not on file  Social History Narrative   Marital status:  Married x 35 years; happily married.      Children: 3 children; no grandchildren.      Lives: with husband.        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        Tobacco:  None      Alcohol: none      Exercise: three days per week ;[employer].  Pilates.         Social Drivers of Corporate investment banker Strain: Low Risk  (01/29/2024)   Overall Financial Resource Strain (CARDIA)    Difficulty of Paying Living Expenses: Not hard at all  Food Insecurity: No Food Insecurity (01/29/2024)   Hunger Vital Sign    Worried About Running Out of Food in the Last Year: Never true    Ran Out of Food in the Last Year: Never true  Transportation Needs: No Transportation Needs (01/29/2024)   PRAPARE - Administrator, Civil Service (Medical): No    Lack of Transportation (Non-Medical): No  Physical Activity: Insufficiently Active (01/29/2024)   Exercise Vital Sign    Days of Exercise per Week: 4 days    Minutes of Exercise per Session: 20 min  Stress: Stress Concern Present (01/29/2024)   [employer] of Occupational Health - Occupational Stress Questionnaire    Feeling of Stress : Rather much  Social Connections: Moderately Integrated (01/29/2024)   Social Connection and Isolation Panel [NHANES]    Frequency of Communication with Friends and Family: More than three times a week    Frequency of Social Gatherings  with Friends and Family: More than three times a week    Attends Religious Services: More than 4 times per year    Active Member of [employer] or Organizations: No    Attends Engineer, structural: Not on file    Marital Status: Married    Tobacco Counseling Counseling given: Not Answered    Clinical Intake:  Pre-visit preparation completed: Yes  Pain : No/denies pain     BMI - recorded: 21.95 Nutritional Status: BMI of 19-24  Normal Nutritional Risks: None Diabetes: No  How often do you need to have  someone help you when you read instructions, pamphlets, or other written materials from your doctor or pharmacy?: 1 - Never  Interpreter Needed?: No  Information entered by :: Lanier Ensign, LPN   Activities of Daily Living     01/30/2024    2:52 PM  In your present state of health, do you have any difficulty performing the following activities:  Hearing? 0  Vision? 0  Difficulty concentrating or making decisions? 0  Walking or climbing stairs? 0  Dressing or bathing? 0  Doing errands, shopping? 0  Preparing Food and eating ? N  Using the Toilet? N  In the past six months, have you accidently leaked urine? N  Do you have problems with loss of bowel control? N  Managing your Medications? N  Managing your Finances? N  Housekeeping or managing your Housekeeping? N    Patient Care Team: Dulce Sellar, NP as PCP - General (Family Medicine) Yates Decamp, MD as PCP - Cardiology (Cardiology)  Indicate any recent Medical Services you may have received from other than [facility] providers in the past year (date may be approximate).     Assessment:   This is a routine wellness examination for Kamila.  Hearing/Vision screen Hearing Screening - Comments:: Pt denies any hearing issue Vision Screening - Comments:: Pt [facility] annul ey exams    Goals Addressed   None    Depression Screen     01/30/2024    2:55 PM 11/21/2022    3:05 PM  08/18/2022    3:32 PM 05/14/2020   10:12 AM 03/26/2020   10:45 AM 11/04/2019    3:26 PM 05/29/2019    5:22 PM  PHQ 2/9 Scores  PHQ - 2 Score 0 0 4 0 0 0 0  PHQ- 9 Score   13    3    Fall Risk     01/30/2024    2:56 PM 11/21/2022    3:07 PM 11/19/2022    8:15 PM 10/18/2022   10:25 AM 08/18/2022    3:01 PM  Fall Risk   Falls in the past year? 0 0 0 0 0  Number falls in past yr: 0 0 0 0 0  Injury with Fall? 0 0 0 0 0  Risk for fall due to : No Fall Risks Impaired vision  No Fall Risks No Fall Risks  Follow up Falls prevention discussed Falls prevention discussed  Falls evaluation completed Falls evaluation completed;Education provided    MEDICARE RISK AT HOME:  Medicare Risk at Home Any stairs in or around the home?: Yes If so, are there any without handrails?: No Home free of loose throw rugs in walkways, pet beds, electrical cords, etc?: Yes Adequate lighting in your home to reduce risk of falls?: Yes Life alert?: No Use of a cane, walker or w/c?: No Grab bars in the bathroom?: Yes Shower chair or bench in shower?: No Elevated toilet seat or a handicapped toilet?: No  TIMED UP AND GO:  Was the test performed?  No  Cognitive Function: 6CIT completed        01/30/2024    2:57 PM 11/21/2022    3:08 PM  6CIT Screen  What Year? 0 points 0 points  What month? 0 points 0 points  What time? 0 points 0 points  Count back from 20 0 points 0 points  Months in reverse 0 points 0 points  Repeat phrase 0 points 0 points  Total Score 0 points  0 points    Immunizations Immunization History  Administered Date(s) Administered   Fluad Quad(high Dose 65+) 10/18/2022   Influenza,inj,Quad PF,6+ Mos 11/18/2014, 10/04/2015, 10/02/2016, 01/09/2018, 11/18/2018, 10/02/2019   Tdap 11/18/2014, 05/13/2018    Screening Tests Health Maintenance  Topic Date Due   Zoster Vaccines- Shingrix (1 of 2) 02/06/2024 (Originally 05/19/2005)   INFLUENZA VACCINE  03/03/2024 (Originally 07/05/2023)    Pneumonia Vaccine 9+ Years old (1 of 1 - PCV) 11/07/2024 (Originally 05/19/2020)   Colonoscopy  12/12/2024   Medicare Annual Wellness (AWV)  01/29/2025   MAMMOGRAM  01/31/2025   DTaP/Tdap/Td (3 - Td or Tdap) 05/13/2028   DEXA SCAN  Completed   Hepatitis C Screening  Completed   HPV VACCINES  Aged Out   COVID-19 Vaccine  Discontinued    Health Maintenance  There are no preventive care reminders to display for this patient.  Health Maintenance Items Addressed:   Additional Screening:  Vision Screening: Recommended annual ophthalmology exams for early detection of glaucoma and other disorders of the eye.  Dental Screening: Recommended annual dental exams for proper oral hygiene  Community Resource Referral / Chronic Care Management: CRR required this visit?  No   CCM required this visit?  No     Plan:     I have personally reviewed and noted the following in the patient's chart:   Medical and social history Use of alcohol, tobacco or illicit drugs  Current medications and supplements including opioid prescriptions. Patient is not currently taking opioid prescriptions. Functional ability and status Nutritional status Physical activity Advanced directives List of other physicians Hospitalizations, surgeries, and ER visits in previous 12 months Vitals Screenings to include cognitive, depression, and falls Referrals and appointments  In addition, I have reviewed and discussed with patient certain preventive protocols, quality metrics, and best practice recommendations. A written personalized care plan for preventive services as well as general preventive health recommendations were provided to patient.     Marzella Schlein, LPN   03/12/8118   After Visit Summary: (MyChart) Due to this being a telephonic visit, the after visit summary with patients personalized plan was offered to patient via MyChart   Notes: Please refer to Routing Comments.

## 2024-01-30 NOTE — Patient Instructions (Signed)
 Ms. Ensz , Thank you for taking time to come for your Medicare Wellness Visit. I appreciate your ongoing commitment to your health goals. Please review the following plan we discussed and let me know if I can assist you in the future.   Referrals/Orders/Follow-Ups/Clinician Recommendations: Aim for 30 minutes of exercise or brisk walking, 6-8 glasses of water, and 5 servings of fruits and vegetables each day.   This is a list of the screening recommended for you and due dates:  Health Maintenance  Topic Date Due   Flu Shot  07/05/2023   Zoster (Shingles) Vaccine (1 of 2) 02/06/2024*   Pneumonia Vaccine (1 of 1 - PCV) 11/07/2024*   Colon Cancer Screening  12/12/2024   Medicare Annual Wellness Visit  01/29/2025   Mammogram  01/31/2025   DTaP/Tdap/Td vaccine (3 - Td or Tdap) 05/13/2028   DEXA scan (bone density measurement)  Completed   Hepatitis C Screening  Completed   HPV Vaccine  Aged Out   COVID-19 Vaccine  Discontinued  *Topic was postponed. The date shown is not the original due date.    Advanced directives: (Declined) Advance directive discussed with you today. Even though you declined this today, please call our office should you change your mind, and we can give you the proper paperwork for you to fill out.

## 2024-02-14 ENCOUNTER — Encounter: Payer: Self-pay | Admitting: Family

## 2024-03-28 ENCOUNTER — Other Ambulatory Visit: Payer: Self-pay | Admitting: Family

## 2024-03-28 DIAGNOSIS — E7849 Other hyperlipidemia: Secondary | ICD-10-CM

## 2024-04-21 ENCOUNTER — Other Ambulatory Visit: Payer: Self-pay | Admitting: Family

## 2024-04-21 DIAGNOSIS — Z1231 Encounter for screening mammogram for malignant neoplasm of breast: Secondary | ICD-10-CM

## 2024-04-23 ENCOUNTER — Other Ambulatory Visit: Payer: Self-pay | Admitting: Family

## 2024-04-23 DIAGNOSIS — F411 Generalized anxiety disorder: Secondary | ICD-10-CM

## 2024-05-12 ENCOUNTER — Ambulatory Visit

## 2024-05-29 ENCOUNTER — Ambulatory Visit
Admission: RE | Admit: 2024-05-29 | Discharge: 2024-05-29 | Disposition: A | Discharge status: Home / Self Care | Source: Ambulatory Visit | Attending: Family | Admitting: Family

## 2024-05-29 DIAGNOSIS — Z1231 Encounter for screening mammogram for malignant neoplasm of breast: Secondary | ICD-10-CM

## 2024-06-22 ENCOUNTER — Encounter: Payer: Self-pay | Admitting: Family

## 2024-06-23 ENCOUNTER — Other Ambulatory Visit: Payer: Self-pay

## 2024-06-23 DIAGNOSIS — F411 Generalized anxiety disorder: Secondary | ICD-10-CM

## 2024-06-23 MED ORDER — ESCITALOPRAM OXALATE 10 MG PO TABS: 10.0000 mg | ORAL_TABLET | Freq: Every day | ORAL | 0 refills | Status: DC

## 2024-08-20 ENCOUNTER — Other Ambulatory Visit: Payer: Self-pay | Admitting: Family

## 2024-08-20 DIAGNOSIS — E7849 Other hyperlipidemia: Secondary | ICD-10-CM

## 2024-08-20 DIAGNOSIS — F411 Generalized anxiety disorder: Secondary | ICD-10-CM

## 2024-08-27 ENCOUNTER — Ambulatory Visit: Admitting: Family

## 2024-08-27 ENCOUNTER — Encounter: Payer: Self-pay | Admitting: Family

## 2024-08-27 VITALS — BP 120/75 | HR 61 | Temp 98.2°F | Ht 62.0 in | Wt 121.2 lb

## 2024-08-27 DIAGNOSIS — E7849 Other hyperlipidemia: Secondary | ICD-10-CM

## 2024-08-27 DIAGNOSIS — Z1283 Encounter for screening for malignant neoplasm of skin: Secondary | ICD-10-CM

## 2024-08-27 DIAGNOSIS — F411 Generalized anxiety disorder: Secondary | ICD-10-CM

## 2024-08-27 DIAGNOSIS — R1084 Generalized abdominal pain: Secondary | ICD-10-CM | POA: Diagnosis not present

## 2024-08-27 DIAGNOSIS — M810 Age-related osteoporosis without current pathological fracture: Secondary | ICD-10-CM

## 2024-08-27 DIAGNOSIS — Z807 Family history of other malignant neoplasms of lymphoid, hematopoietic and related tissues: Secondary | ICD-10-CM | POA: Diagnosis not present

## 2024-08-27 DIAGNOSIS — R200 Anesthesia of skin: Secondary | ICD-10-CM

## 2024-08-27 LAB — COMPREHENSIVE METABOLIC PANEL WITH GFR
ALT: 18 U/L (ref 0–35)
AST: 24 U/L (ref 0–37)
Albumin: 4.5 g/dL (ref 3.5–5.2)
Alkaline Phosphatase: 68 U/L (ref 39–117)
BUN: 15 mg/dL (ref 6–23)
CO2: 29 meq/L (ref 19–32)
Calcium: 9.3 mg/dL (ref 8.4–10.5)
Chloride: 107 meq/L (ref 96–112)
Creatinine, Ser: 0.73 mg/dL (ref 0.40–1.20)
GFR: 84 mL/min (ref >60.00)
Glucose, Bld: 99 mg/dL (ref 70–99)
Potassium: 4.7 meq/L (ref 3.5–5.1)
Sodium: 143 meq/L (ref 135–145)
Total Bilirubin: 0.9 mg/dL (ref 0.2–1.2)
Total Protein: 6.7 g/dL (ref 6.0–8.3)

## 2024-08-27 LAB — CBC WITH DIFFERENTIAL/PLATELET
Basophils Absolute: 0 K/uL (ref 0.0–0.1)
Basophils Relative: 0.8 % (ref 0.0–3.0)
Eosinophils Absolute: 0.1 K/uL (ref 0.0–0.7)
Eosinophils Relative: 1.6 % (ref 0.0–5.0)
HCT: 41.4 % (ref 36.0–46.0)
Hemoglobin: 14 g/dL (ref 12.0–15.0)
Lymphocytes Relative: 25.9 % (ref 12.0–46.0)
Lymphs Abs: 1.3 K/uL (ref 0.7–4.0)
MCHC: 33.7 g/dL (ref 30.0–36.0)
MCV: 93.1 fl (ref 78.0–100.0)
Monocytes Absolute: 0.4 K/uL (ref 0.1–1.0)
Monocytes Relative: 7.5 % (ref 3.0–12.0)
Neutro Abs: 3.2 K/uL (ref 1.4–7.7)
Neutrophils Relative %: 64.2 % (ref 43.0–77.0)
Platelets: 196 K/uL (ref 150.0–400.0)
RBC: 4.45 Mil/uL (ref 3.87–5.11)
RDW: 13.7 % (ref 11.5–15.5)
WBC: 5 K/uL (ref 4.0–10.5)

## 2024-08-27 LAB — VITAMIN D 25 HYDROXY (VIT D DEFICIENCY, FRACTURES): VITD: 69.65 ng/mL (ref 30.00–100.00)

## 2024-08-27 LAB — LIPID PANEL
Cholesterol: 172 mg/dL (ref 0–200)
HDL: 65.9 mg/dL (ref >39.00)
LDL Cholesterol: 92 mg/dL (ref 0–99)
NonHDL: 105.63
Total CHOL/HDL Ratio: 3
Triglycerides: 70 mg/dL (ref 0.0–149.0)
VLDL: 14 mg/dL (ref 0.0–40.0)

## 2024-08-27 MED ORDER — ESCITALOPRAM OXALATE 10 MG PO TABS: 10.0000 mg | ORAL_TABLET | Freq: Every day | ORAL | 1 refills | Status: AC

## 2024-08-27 NOTE — Assessment & Plan Note (Addendum)
 Intolerant to Fosamax , tried a few times last September, managed with vitamin D , calcium  and exercises. Discussed alternative therapies like [facility] in town. - Continue vitamin D3 at 4000-5000 IU daily. - Check Vit D level today - Encourage weight-bearing exercises. - Plan bone density scan in August 2026.

## 2024-08-27 NOTE — Assessment & Plan Note (Signed)
 Improved on Crestor , recent cholesterol levels better. - Continue Crestor  regimen, sending refill. - Recheck lipid panel today - F/U in 1 year

## 2024-08-27 NOTE — Progress Notes (Signed)
 Patient ID: Kelli Mclean, female    DOB: [birth year], 68 y.o.   MRN: [id number]  Chief Complaint  Patient presents with   Generalized anxiety disorder   Foot Swelling    Pt c/o right foot swelling, present for 2 months.    Abdominal Pain    Pt c/o sharp abdominal pain, present for 3 months off and on.    Hyperlipidemia  Discussed the use of AI scribe software for clinical note transcription with the patient, who gave verbal consent to proceed.  History of Present Illness   Kelli Mclean is a 68 year old female who presents with abdominal pain and foot discomfort.  She experiences sharp abdominal pain in the lower left quadrant, varying with movement and most prominent in the morning. The pain is not associated with eating. She has regular bowel movements every other day without constipation, straining, or diarrhea. She has a history of colon polyps, with twelve removed during her last colonoscopy and had been advised to follow up in 1 year, but had to cancel her last appointment.  She also takes vitamin D3 and K2 supplements for osteoporosis, which was diagnosed in her spine, while her hips have osteopenia. She previously tried Fosamax  but discontinued it due to severe acid reflux.  Her family history is significant for her brother, who is currently undergoing chemotherapy for lymphoma. Her mother had breast cancer, and she undergoes annual mammograms due to this family history.  She has a history of endometrial cancer for which she underwent a total hysterectomy, including removal of the ovaries and tubes. She is concerned about her bladder, feeling as though it is 'coming down,' but denies any urinary symptoms such as pressure, frequent urination, or urinary tract infections.  She describes discomfort in her right foot as feeling 'bad, fat, and numb, without tingling or swelling. The discomfort is unaffected by footwear. Her feet have always been wide, and she notes a change in foot  width with age.     Assessment and Plan    Abdominal pain  Bowel movements every other day, not hard or diarrheal. Magnesium aids bowel movements. Possibly musculoskeletal, no bowel obstruction or kidney involvement. History of endometrial cancer with complete hysterectomy including ovaries reduces gynecological causes. - Increase magnesium to twice daily, monitor for loose stools, can take up to 1200mg  daily. - Increase dietary fiber with dark greens and whole grains. - Ensure daily soft BM - Ensure hydration with at least two liters of caffeine-free fluids daily. - Call gastroenterology office to reschedule appointment, phone # provided.  Right foot discomfort with great toe numbness  Possibly age-related widening of foot or nerve issues, no diabetes or systemic causes. No swelling or erythema noted today. - Consider wider shoes and use insoles appropriate for the arch with cushion. - Call back if sx are worsening.  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 Discussed alternative therapies like [facility] in town. - Continue vitamin D3 at 4000-5000 IU daily. - Check Vit D level today - Encourage weight-bearing exercises. - Plan bone density scan in August 2026.  Hyperlipidemia Improved on Crestor , recent cholesterol levels better. - Continue Crestor  regimen, sending refill. - Recheck lipid panel today - F/U in 1 year  Anxiety chronic, stable found out brother has Lymphoma not long ago taking Lexapro  10g qd & Hydroxyzine  10mg  to take prn sending refill of Lexapro  f/u in  6 months  Multiple cutaneous nevi No changes noted, no recent dermatological scan. - Refer to dermatology for full skin examination. - Advise daily sunscreen use, especially due to greenhouse work.      Subjective:    Outpatient Medications Prior to Visit  Medication Sig Dispense  Refill   B Complex Vitamins (B-COMPLEX/B-12) TABS Take by mouth.     CALCIUM  PO Take 600 mg by mouth.     Cholecalciferol (VITAMIN D3) 1.25 MG (50000 UT) CAPS Take by mouth.     MAGNESIUM PO Take 500 mg by mouth.     Multiple Vitamins-Minerals (MULTIVITAMIN WITH MINERALS) tablet Take 1 tablet by mouth daily.     rosuvastatin  (CRESTOR ) 20 MG tablet TAKE 1 TABLET EVERY OTHER DAY 45 tablet 0   Vitamin D -Vitamin K (VITAMIN K2-VITAMIN D3 PO) Take 20 mg by mouth.     escitalopram  (LEXAPRO ) 10 MG tablet TAKE 1 TABLET DAILY (PLEASE SCHEDULE FOLLOW UP) 30 tablet 0   No facility-administered medications prior to visit.   Past Medical History:  Diagnosis Date   Anxiety    Endometrial cancer, grade I (HCC) 12/04/2010   s/p hysterectomy/BSO/washings;  [facility].   Flank pain 03/26/2020   Hyperlipidemia    Osteoporosis    Pyelonephritis 03/26/2020   Past Surgical History:  Procedure Laterality Date   ABDOMINAL HYSTERECTOMY  12/04/2010   uterine cancer; B oophorectomy.   BREAST DUCTAL SYSTEM EXCISION Right 08/04/2015   Procedure: RIGHT NIPPLE DUCT EXCISION;  Surgeon: Donnice Lima, MD;  Location: [facility] OR;  Service: General;  Laterality: Right;   BREAST EXCISIONAL BIOPSY Right 08/04/2015   duct excision   EXTRACORPOREAL SHOCK WAVE LITHOTRIPSY Left 06/17/2020   Procedure: LEFT EXTRACORPOREAL SHOCK WAVE LITHOTRIPSY (ESWL);  Surgeon: Rosalind Zachary NOVAK, MD;  [facility];  Service: Urology;  Laterality: Left;   FOOT SURGERY     RT  BONE SPUR   No Known Allergies    Objective:    Physical Exam Vitals and nursing note reviewed.  Constitutional:      Appearance: Normal appearance.  Cardiovascular:     Rate and Rhythm: Normal rate and regular rhythm.  Pulmonary:     Effort: Pulmonary effort is normal.     Breath sounds: Normal breath sounds.  Musculoskeletal:        General: Normal range of motion.  Skin:    General: Skin is warm and dry.  Neurological:     Mental  Status: She is alert.  Psychiatric:        Mood and Affect: Mood normal.        Behavior: Behavior normal.    BP 120/75 (BP Location: Left Arm, Patient Position: Sitting, Cuff Size: Large)   Pulse 61   Temp 98.2 F (36.8 C) (Temporal)   Ht 5' 2 (1.575 m)   Wt 121 lb 3.2 oz (55 kg)   SpO2 98%   BMI 22.17 kg/m  Wt Readings from Last 3 Encounters:  08/27/24 121 lb 3.2 oz (55 kg)  01/30/24 120 lb (54.4 kg)  01/09/24 120 lb (54.4 kg)      Lucius Krabbe, NP

## 2024-08-27 NOTE — Patient Instructions (Addendum)
 It was very nice to see you today!  I will review your lab results via MyChart in a few days.  Be sure you are taking 4,000 units ( ) daily of Vitamin D3 to help absorb Calcium  into your bones. No more than 600mg  of calcium  daily to avoid constipation. Increase your magnesium to twice a day to help with daily soft BM's. You can take up to 1200mg  daily. Increase caffeine free liquids to 2.5 liters daily! Keep doing weight bearing exercise daily. Can check out [facility] for osteoporosis if interested.  Please schedule a 6 month follow up visit for medication refills.         PLEASE NOTE:  If you had any lab tests please let us  know if you have not heard back within a few days. You may see your results on MyChart before we have a chance to review them but we will give you a call once they are reviewed by us . If we ordered any referrals today, please let us  know if you have not heard from their office within the next week.

## 2024-08-27 NOTE — Assessment & Plan Note (Signed)
 chronic, stable found out brother has Lymphoma not long ago taking Lexapro  10g qd & Hydroxyzine  10mg  to take prn sending refill of Lexapro  f/u in 6 months

## 2024-08-30 ENCOUNTER — Ambulatory Visit: Payer: Self-pay | Admitting: Family

## 2024-12-16 ENCOUNTER — Other Ambulatory Visit: Payer: Self-pay | Admitting: Family

## 2024-12-16 DIAGNOSIS — E7849 Other hyperlipidemia: Secondary | ICD-10-CM

## 2024-12-25 ENCOUNTER — Emergency Department (HOSPITAL_BASED_OUTPATIENT_CLINIC_OR_DEPARTMENT_OTHER): Admitting: Radiology

## 2024-12-25 ENCOUNTER — Other Ambulatory Visit: Payer: Self-pay

## 2024-12-25 ENCOUNTER — Emergency Department (HOSPITAL_BASED_OUTPATIENT_CLINIC_OR_DEPARTMENT_OTHER)
Admission: EM | Admit: 2024-12-25 | Discharge: 2024-12-25 | Disposition: A | Discharge status: Home / Self Care | Attending: Emergency Medicine | Admitting: Emergency Medicine

## 2024-12-25 DIAGNOSIS — R059 Cough, unspecified: Secondary | ICD-10-CM | POA: Diagnosis not present

## 2024-12-25 DIAGNOSIS — R0602 Shortness of breath: Secondary | ICD-10-CM | POA: Diagnosis present

## 2024-12-25 DIAGNOSIS — J101 Influenza due to other identified influenza virus with other respiratory manifestations: Secondary | ICD-10-CM | POA: Insufficient documentation

## 2024-12-25 LAB — CBC WITH DIFFERENTIAL/PLATELET
Abs Immature Granulocytes: 0.01 K/uL (ref 0.00–0.07)
Basophils Absolute: 0 K/uL (ref 0.0–0.1)
Basophils Relative: 0 %
Eosinophils Absolute: 0 K/uL (ref 0.0–0.5)
Eosinophils Relative: 0 %
HCT: 42.2 % (ref 36.0–46.0)
Hemoglobin: 14.2 g/dL (ref 12.0–15.0)
Immature Granulocytes: 0 %
Lymphocytes Relative: 9 %
Lymphs Abs: 0.4 K/uL — ABNORMAL LOW (ref 0.7–4.0)
MCH: 31.2 pg (ref 26.0–34.0)
MCHC: 33.6 g/dL (ref 30.0–36.0)
MCV: 92.7 fL (ref 80.0–100.0)
Monocytes Absolute: 0.3 K/uL (ref 0.1–1.0)
Monocytes Relative: 8 %
Neutro Abs: 3.5 K/uL (ref 1.7–7.7)
Neutrophils Relative %: 83 %
Platelets: 199 K/uL (ref 150–400)
RBC: 4.55 MIL/uL (ref 3.87–5.11)
RDW: 13.2 % (ref 11.5–15.5)
WBC: 4.2 K/uL (ref 4.0–10.5)
nRBC: 0 % (ref 0.0–0.2)

## 2024-12-25 LAB — BASIC METABOLIC PANEL WITH GFR
Anion gap: 12 (ref 5–15)
BUN: 10 mg/dL (ref 8–23)
CO2: 24 mmol/L (ref 22–32)
Calcium: 9.3 mg/dL (ref 8.9–10.3)
Chloride: 102 mmol/L (ref 98–111)
Creatinine, Ser: 0.66 mg/dL (ref 0.44–1.00)
GFR, Estimated: >60 mL/min
Glucose, Bld: 120 mg/dL — ABNORMAL HIGH (ref 70–99)
Potassium: 3.9 mmol/L (ref 3.5–5.1)
Sodium: 138 mmol/L (ref 135–145)

## 2024-12-25 LAB — RESP PANEL BY RT-PCR (RSV, FLU A&B, COVID)  RVPGX2
Influenza A by PCR: POSITIVE — AB
Influenza B by PCR: NEGATIVE
Resp Syncytial Virus by PCR: NEGATIVE
SARS Coronavirus 2 by RT PCR: NEGATIVE

## 2024-12-25 LAB — TROPONIN T, HIGH SENSITIVITY: Troponin T High Sensitivity: <6 ng/L (ref 0–19)

## 2024-12-25 MED ORDER — SODIUM CHLORIDE 0.9 % IV BOLUS
1000.0000 mL | Freq: Once | INTRAVENOUS | Status: AC
  Administered 2024-12-25: 1000 mL via INTRAVENOUS

## 2024-12-25 MED ORDER — ONDANSETRON HCL 4 MG PO TABS: 4.0000 mg | ORAL_TABLET | Freq: Four times a day (QID) | ORAL | 0 refills | Status: AC

## 2024-12-25 MED ORDER — BENZONATATE 100 MG PO CAPS: 100.0000 mg | ORAL_CAPSULE | Freq: Three times a day (TID) | ORAL | 0 refills | Status: AC

## 2024-12-25 MED ORDER — ACETAMINOPHEN 325 MG PO TABS
650.0000 mg | ORAL_TABLET | Freq: Once | ORAL | Status: AC
  Administered 2024-12-25: 650 mg via ORAL
  Filled 2024-12-25: qty 2

## 2024-12-25 MED ORDER — ONDANSETRON HCL 4 MG/2ML IJ SOLN
4.0000 mg | Freq: Once | INTRAMUSCULAR | Status: AC
  Administered 2024-12-25: 4 mg via INTRAVENOUS
  Filled 2024-12-25: qty 2

## 2024-12-25 NOTE — ED Notes (Signed)
 Reviewed AVS/discharge instruction with patient. Time allotted for and all questions answered. Patient is agreeable for d/c and escorted to ed exit by staff.

## 2024-12-25 NOTE — Discharge Instructions (Signed)
 You were seen in the emergency department for your fever, cough and congestion.  You tested positive for influenza A.  You did not require treatment with any antiviral medications can continue symptomatic treatment with Tylenol  and Motrin as needed for fevers and bodyaches and both can be taken up to every 6 hours.  I have given you prescription for Zofran  that you can take as needed for nausea.  You can try Mucinex or raw honey as needed for your cough and you can use a humidifier or hot steam from the shower to help with your congestion as well as over-the-counter and nasal decongestant sprays.  You can follow-up with your primary doctor in the next few days to have your symptoms rechecked.  You should return to the emergency department for significantly worsening shortness of breath, severe chest pain, repetitive vomiting or if you have any other new or concerning symptoms.

## 2024-12-25 NOTE — ED Triage Notes (Signed)
 Patient states shortness of breath, chills, and headache since last week. States shortness of breath has worsened.

## 2024-12-25 NOTE — ED Provider Notes (Signed)
 " [place] [facility] AT [facility] Provider Note   CSN: [id number] Arrival date & time: 12/25/24  1236     Patient presents with: Shortness of Breath   Kelli Mclean is a 69 y.o. female.   Patient is a 69 year old female with no significant past medical history presenting to the emergency department with chest pain.  The patient states for about the last week she has had on and off chest pressure.  She states that last night the pain significantly worsened and she was having trouble sleeping due to the pain.  She states that she has had some mild associated shortness of breath and cough.  She states that today she started to develop bodyaches and chills.  She states that she is nauseous but has not vomited, denies any diarrhea.  She denies any lower extremity swelling.  She denies any known sick contacts.  The history is provided by the patient.  Shortness of Breath      Prior to Admission medications  Medication Sig Start Date End Date Taking? Authorizing Provider  benzonatate  (TESSALON ) 100 MG capsule Take 1 capsule (100 mg total) by mouth every 8 (eight) hours. 12/25/24  Yes Ellouise, Niall Illes K, DO  ondansetron  (ZOFRAN ) 4 MG tablet Take 1 tablet (4 mg total) by mouth every 6 (six) hours. 12/25/24  Yes Kingsley, Brayla Pat K, DO  B Complex Vitamins (B-COMPLEX/B-12) TABS Take by mouth.    [provider]  CALCIUM  PO Take 600 mg by mouth.    [provider]  Cholecalciferol (VITAMIN D3) 1.25 MG (50000 UT) CAPS Take by mouth.    [provider]  escitalopram  (LEXAPRO ) 10 MG tablet Take 1 tablet (10 mg total) by mouth daily. 08/27/24   Lucius Krabbe, NP  MAGNESIUM PO Take 500 mg by mouth.    [provider]  Multiple Vitamins-Minerals (MULTIVITAMIN WITH MINERALS) tablet Take 1 tablet by mouth daily.    [provider]  rosuvastatin  (CRESTOR ) 20 MG tablet TAKE 1 TABLET EVERY OTHER DAY 12/16/24   Lucius Krabbe, NP   Vitamin D -Vitamin K (VITAMIN K2-VITAMIN D3 PO) Take 20 mg by mouth.    [provider]    Allergies: Patient has no known allergies.    Review of Systems  Respiratory:  Positive for shortness of breath.     Updated Vital Signs BP 133/73   Pulse 79   Temp 100.1 F (37.8 C) (Oral)   Resp 18   SpO2 97%   Physical Exam Vitals and nursing note reviewed.  Constitutional:      General: She is not in acute distress.    Appearance: She is well-developed.  HENT:     Head: Normocephalic and atraumatic.     Mouth/Throat:     Mouth: Mucous membranes are moist.  Eyes:     Extraocular Movements: Extraocular movements intact.  Cardiovascular:     Rate and Rhythm: Normal rate and regular rhythm.     Heart sounds: Normal heart sounds.  Pulmonary:     Effort: Pulmonary effort is normal.     Breath sounds: Normal breath sounds.  Chest:     Chest wall: No tenderness.  Abdominal:     Palpations: Abdomen is soft.     Tenderness: There is no abdominal tenderness.  Musculoskeletal:        General: Normal range of motion.     Cervical back: Normal range of motion and neck supple.     Right lower leg: No edema.  Left lower leg: No edema.  Skin:    General: Skin is warm and dry.  Neurological:     General: No focal deficit present.     Mental Status: She is alert and oriented to person, place, and time.  Psychiatric:        Mood and Affect: Mood normal.        Behavior: Behavior normal.     (all labs ordered are listed, but only abnormal results are displayed) Labs Reviewed  RESP PANEL BY RT-PCR (RSV, FLU A&B, COVID)  RVPGX2 - Abnormal; Notable for the following components:      Result Value   Influenza A by PCR POSITIVE (*)    All other components within normal limits  CBC WITH DIFFERENTIAL/PLATELET - Abnormal; Notable for the following components:   Lymphs Abs 0.4 (*)    All other components within normal limits  BASIC METABOLIC PANEL WITH GFR - Abnormal; Notable  for the following components:   Glucose, Bld 120 (*)    All other components within normal limits  TROPONIN T, HIGH SENSITIVITY    EKG: EKG Interpretation Date/Time:  Thursday December 25 2024 12:43:28 EST Ventricular Rate:  90 PR Interval:  132 QRS Duration:  88 QT Interval:  359 QTC Calculation: 440 R Axis:   80  Text Interpretation: Sinus rhythm Borderline T abnormalities, inferior leads Confirmed by Ellouise Fine ([id number]) on 12/25/2024 2:00:38 PM  Radiology: DG Chest 2 View Result Date: 12/25/2024 CLINICAL DATA:  Shortness of breath. EXAM: CHEST - 2 VIEW COMPARISON:  Chest radiograph dated 03/24/2015. FINDINGS: The heart size and mediastinal contours are within normal limits. Both lungs are clear. The visualized skeletal structures are unremarkable. IMPRESSION: No active cardiopulmonary disease. Electronically Signed   By: Vanetta Chou M.D.   On: 12/25/2024 13:28     Procedures   Medications Ordered in the ED  ondansetron  (ZOFRAN ) injection 4 mg (4 mg Intravenous Given 12/25/24 1427)  acetaminophen  (TYLENOL ) tablet 650 mg (650 mg Oral Given 12/25/24 1428)  sodium chloride  0.9 % bolus 1,000 mL (1,000 mLs Intravenous New Bag/Given 12/25/24 1427)    Clinical Course as of 12/25/24 1508  Thu Dec 25, 2024  1504 Remainder of labs within normal range. CP since last night so single troponin is sufficient. Patient is stable for discharge home with outpatient follow up. [VK]    Clinical Course User Index [VK] Kingsley, Alyan Hartline K, DO                                 Medical Decision Making This patient presents to the ED with chief complaint(s) of chest pain with no pertinent past medical history which further complicates the presenting complaint. The complaint involves an extensive differential diagnosis and also carries with it a high risk of complications and morbidity.    The differential diagnosis includes ACS, arrhythmia, anemia, pneumonia, pneumothorax, pulmonary edema,  pleural effusion, viral syndrome, pericarditis, myocarditis, MSK pain  Additional history obtained: Additional history obtained from N/A Records reviewed Primary Care Documents  ED Course and Reassessment: On patient's arrival she had a low-grade fever and otherwise is hemodynamically stable in no acute distress.  She had viral swab, EKG and chest x-ray initiated in triage.  EKG showed normal sinus rhythm without acute ischemic changes, chest x-ray showed no acute disease.  She did test positive for influenza A.  With patient's age and associated chest pain, will have labs including troponin  performed.  She was given Tylenol  for fever and symptomatic treatment and will be closely reassessed.  Independent labs interpretation:  The following labs were independently interpreted: influenza A positive, otherwise labs within normal range  Independent visualization of imaging: - I independently visualized the following imaging with scope of interpretation limited to determining acute life threatening conditions related to emergency care: CXR, which revealed no acute disease  Consultation: - Consulted or discussed management/test interpretation w/ external professional: N/A  Consideration for admission or further workup: Patient has no emergent conditions requiring admission or further work-up at this time and is stable for discharge home with primary care follow-up  Social Determinants of health: N/A    Amount and/or Complexity of Data Reviewed Labs: ordered. Radiology: ordered.  Risk OTC drugs. Prescription drug management.       Final diagnoses:  Influenza A    ED Discharge Orders          Ordered    ondansetron  (ZOFRAN ) 4 MG tablet  Every 6 hours        12/25/24 1507    benzonatate  (TESSALON ) 100 MG capsule  Every 8 hours        12/25/24 1507               Kingsley, Beautiful Pensyl K, DO 12/25/24 1508  "

## 2024-12-26 ENCOUNTER — Telehealth: Payer: Self-pay

## 2024-12-26 NOTE — Telephone Encounter (Signed)
 Transition Care Management Follow-up Telephone Call Date of discharge and from where: 12/25/2024 How have you been since you were released from the hospital? [facility] Any questions or concerns? No  Items Reviewed: Did the pt receive and understand the discharge instructions provided? Yes  Medications obtained and verified? Yes  Other? NA Any new allergies since your discharge? No  Dietary orders reviewed? No Do you have support at home? Yes   Home Care and Equipment/Supplies: Were home health services ordered? not applicable If so, what is the name of the agency?   Has the agency set up a time to come to the patient's home? not applicable Were any new equipment or medical supplies ordered?  No What is the name of the medical supply agency?  Were you able to get the supplies/equipment? not applicable Do you have any questions related to the use of the equipment or supplies?   Functional Questionnaire: (I = Independent and D = Dependent) ADLs: I  Bathing/Dressing- I  Meal Prep- I  Eating- I  Maintaining continence- I  Transferring/Ambulation- I  Managing Meds- I  Follow up appointments reviewed:  PCP Hospital f/u appt confirmed? No  , Patient will call to schedule if sym[ptoms get worse.  Specialist Hospital f/u appt confirmed? NA Are transportation arrangements needed? No  If their condition worsens, is the pt aware to call PCP or go to the Emergency Dept.? Yes Was the patient provided with contact information for the PCP's office or ED? Yes Was to pt encouraged to call back with questions or concerns? Yes

## 2025-02-05 ENCOUNTER — Ambulatory Visit: Payer: Medicare Other

## 2025-02-25 ENCOUNTER — Ambulatory Visit: Admitting: Family

## 2025-04-15 ENCOUNTER — Ambulatory Visit: Admitting: Physician Assistant
